# Patient Record
Sex: Female | Born: 1995 | Race: Black or African American | Hispanic: No | Smoking: Never smoker
Health system: Southern US, Community
[De-identification: ages and names within clinical notes are randomized; demographics above are authoritative.]

## PROBLEM LIST (undated history)

## (undated) DIAGNOSIS — J45909 Unspecified asthma, uncomplicated: Secondary | ICD-10-CM

---

## 2019-11-07 ENCOUNTER — Encounter: Payer: Self-pay | Admitting: Emergency Medicine

## 2019-11-07 ENCOUNTER — Observation Stay
Admission: EM | Admit: 2019-11-07 | Discharge: 2019-11-08 | Disposition: A | Discharge status: Home / Self Care | Payer: Self-pay | Attending: Obstetrics and Gynecology | Admitting: Obstetrics and Gynecology

## 2019-11-07 ENCOUNTER — Encounter: Payer: Self-pay | Admitting: Radiology

## 2019-11-07 ENCOUNTER — Ambulatory Visit (INDEPENDENT_AMBULATORY_CARE_PROVIDER_SITE_OTHER)
Admission: EM | Admit: 2019-11-07 | Discharge: 2019-11-07 | Disposition: A | Discharge status: Home / Self Care | Payer: BLUE CROSS/BLUE SHIELD | Source: Home / Self Care

## 2019-11-07 ENCOUNTER — Other Ambulatory Visit: Payer: Self-pay

## 2019-11-07 ENCOUNTER — Emergency Department: Payer: PRIVATE HEALTH INSURANCE

## 2019-11-07 DIAGNOSIS — G8929 Other chronic pain: Secondary | ICD-10-CM

## 2019-11-07 DIAGNOSIS — N83209 Unspecified ovarian cyst, unspecified side: Secondary | ICD-10-CM | POA: Diagnosis present

## 2019-11-07 DIAGNOSIS — R1031 Right lower quadrant pain: Secondary | ICD-10-CM | POA: Insufficient documentation

## 2019-11-07 DIAGNOSIS — R52 Pain, unspecified: Secondary | ICD-10-CM

## 2019-11-07 DIAGNOSIS — R82998 Other abnormal findings in urine: Secondary | ICD-10-CM

## 2019-11-07 DIAGNOSIS — Z79899 Other long term (current) drug therapy: Secondary | ICD-10-CM | POA: Insufficient documentation

## 2019-11-07 DIAGNOSIS — R1030 Lower abdominal pain, unspecified: Secondary | ICD-10-CM

## 2019-11-07 DIAGNOSIS — R1032 Left lower quadrant pain: Secondary | ICD-10-CM

## 2019-11-07 DIAGNOSIS — Z20822 Contact with and (suspected) exposure to covid-19: Secondary | ICD-10-CM | POA: Insufficient documentation

## 2019-11-07 DIAGNOSIS — R112 Nausea with vomiting, unspecified: Secondary | ICD-10-CM

## 2019-11-07 DIAGNOSIS — R109 Unspecified abdominal pain: Secondary | ICD-10-CM

## 2019-11-07 DIAGNOSIS — N83201 Unspecified ovarian cyst, right side: Principal | ICD-10-CM | POA: Insufficient documentation

## 2019-11-07 DIAGNOSIS — M549 Dorsalgia, unspecified: Secondary | ICD-10-CM | POA: Insufficient documentation

## 2019-11-07 HISTORY — DX: Unspecified asthma, uncomplicated: J45.909

## 2019-11-07 LAB — CBC WITH DIFFERENTIAL/PLATELET
Abs Immature Granulocytes: 0.02 10*3/uL (ref 0.00–0.07)
Basophils Absolute: 0 10*3/uL (ref 0.0–0.1)
Basophils Relative: 0 %
Eosinophils Absolute: 0 10*3/uL (ref 0.0–0.5)
Eosinophils Relative: 0 %
HCT: 40.5 % (ref 36.0–46.0)
Hemoglobin: 13.2 g/dL (ref 12.0–15.0)
Immature Granulocytes: 0 %
Lymphocytes Relative: 12 %
Lymphs Abs: 1.1 10*3/uL (ref 0.7–4.0)
MCH: 27.8 pg (ref 26.0–34.0)
MCHC: 32.6 g/dL (ref 30.0–36.0)
MCV: 85.4 fL (ref 80.0–100.0)
Monocytes Absolute: 0.4 10*3/uL (ref 0.1–1.0)
Monocytes Relative: 5 %
Neutro Abs: 7.5 10*3/uL (ref 1.7–7.7)
Neutrophils Relative %: 83 %
Platelets: 328 10*3/uL (ref 150–400)
RBC: 4.74 MIL/uL (ref 3.87–5.11)
RDW: 14 % (ref 11.5–15.5)
WBC: 9.1 10*3/uL (ref 4.0–10.5)
nRBC: 0 % (ref 0.0–0.2)

## 2019-11-07 LAB — COMPREHENSIVE METABOLIC PANEL
ALT: 19 U/L (ref 0–44)
AST: 23 U/L (ref 15–41)
Albumin: 4.3 g/dL (ref 3.5–5.0)
Alkaline Phosphatase: 84 U/L (ref 38–126)
Anion gap: 7 (ref 5–15)
BUN: 11 mg/dL (ref 6–20)
CO2: 22 mmol/L (ref 22–32)
Calcium: 9 mg/dL (ref 8.9–10.3)
Chloride: 105 mmol/L (ref 98–111)
Creatinine, Ser: 0.58 mg/dL (ref 0.44–1.00)
GFR calc Af Amer: >60 mL/min (ref >60)
GFR calc non Af Amer: >60 mL/min (ref >60)
Glucose, Bld: 102 mg/dL — ABNORMAL HIGH (ref 70–99)
Potassium: 4 mmol/L (ref 3.5–5.1)
Sodium: 134 mmol/L — ABNORMAL LOW (ref 135–145)
Total Bilirubin: 1.2 mg/dL (ref 0.3–1.2)
Total Protein: 7.4 g/dL (ref 6.5–8.1)

## 2019-11-07 LAB — URINALYSIS, COMPLETE (UACMP) WITH MICROSCOPIC
Bilirubin Urine: NEGATIVE
Glucose, UA: NEGATIVE mg/dL
Hgb urine dipstick: NEGATIVE
Ketones, ur: NEGATIVE mg/dL
Leukocytes,Ua: NEGATIVE
Nitrite: NEGATIVE
RBC / HPF: NONE SEEN RBC/hpf (ref 0–5)
Specific Gravity, Urine: 1.02 (ref 1.005–1.030)
pH: 7.5 (ref 5.0–8.0)

## 2019-11-07 LAB — LIPASE, BLOOD: Lipase: 19 U/L (ref 11–51)

## 2019-11-07 LAB — PREGNANCY, URINE: Preg Test, Ur: NEGATIVE

## 2019-11-07 MED ORDER — KETOROLAC TROMETHAMINE 60 MG/2ML IM SOLN
60.0000 mg | Freq: Once | INTRAMUSCULAR | Status: AC
  Administered 2019-11-07: 60 mg via INTRAMUSCULAR

## 2019-11-07 MED ORDER — IOHEXOL 300 MG/ML  SOLN
75.0000 mL | Freq: Once | INTRAMUSCULAR | Status: AC | PRN
  Administered 2019-11-07: 75 mL via INTRAVENOUS

## 2019-11-07 MED ORDER — MORPHINE SULFATE (PF) 4 MG/ML IV SOLN
4.0000 mg | Freq: Once | INTRAVENOUS | Status: AC
  Administered 2019-11-07: 4 mg via INTRAVENOUS
  Filled 2019-11-07: qty 1

## 2019-11-07 MED ORDER — SODIUM CHLORIDE 0.9 % IV BOLUS
1000.0000 mL | Freq: Once | INTRAVENOUS | Status: AC
  Administered 2019-11-07: 1000 mL via INTRAVENOUS

## 2019-11-07 MED ORDER — NAPROXEN 500 MG PO TABS: 500.0000 mg | ORAL_TABLET | Freq: Two times a day (BID) | ORAL | 0 refills | Status: DC | PRN

## 2019-11-07 MED ORDER — ONDANSETRON HCL 4 MG/2ML IJ SOLN
4.0000 mg | Freq: Once | INTRAMUSCULAR | Status: AC
  Administered 2019-11-07: 4 mg via INTRAVENOUS
  Filled 2019-11-07: qty 2

## 2019-11-07 MED ORDER — IOHEXOL 9 MG/ML PO SOLN
500.0000 mL | Freq: Two times a day (BID) | ORAL | Status: DC | PRN
  Administered 2019-11-07 (×2): 500 mL via ORAL

## 2019-11-07 NOTE — ED Provider Notes (Signed)
-----------------------------------------   11:54 PM on 11/07/2019 -----------------------------------------  Assuming care from Dr. Lenard Lance.  In short, Lakely Elmendorf is a 24 y.o. female with a chief complaint of abdominal and back pain.  Refer to the original H&P for additional details.  The current plan of care is to follow up transvaginal U/S and reassess.    12:11 AM The patient is hurting worse now, ordering morphine 4 mg IV and Toradol 15 mg IV.   ----------------------------------------- 2:05 AM on 11/08/2019 -----------------------------------------  The patient's pain had improved but now is worsening again and she reports it is severe.  Ultrasound demonstrates small to moderate pelvic free fluid and a right sided hemorrhagic ovarian cyst or paraovarian cyst.  No evidence of torsion.  Given the patient's persistent and severe pain and discomfort and persistent nausea and vomiting, she does not think she would be able to go home.  The beta-hCG is negative and her repeat CBC shows a drop in hemoglobin of just over one-point in the last 8 hours.  I discussed the case by phone with Dr. Logan Bores with OB/GYN.  He said that he would put in admission orders to observe her overnight.  I have ordered Dilaudid 1 mg IV and another dose of Zofran 4 mg IV for her discomfort.  I will hold off on any fluids given that she is hemodynamically stable and I do not want to artificially washout her hemoglobin so they can get a good and accurate repeat measurement in the morning.  Updated patient.  Covid test pending.  I have ordered the rapid PCR in the event her bleeding becomes worse and she requires surgical intervention.   Final diagnoses:  Lower abdominal pain  Intractable pain  Nausea and vomiting, intractability of vomiting not specified, unspecified vomiting type  Right ovarian cyst      Loleta Rose, MD 11/08/19 0210

## 2019-11-07 NOTE — ED Triage Notes (Addendum)
Patient c/o generalized abdominal pain radiating to back. Patient reports being seen at urgent care for same today. Patient reports that she had blood work performed, but no imaging. Patient was told to go to ED if symptoms don't resolve. Patient given Toradol IM at 1700.  Patient had CBC, metabolic panel, and urinalysis done at [facility] in [place] today.

## 2019-11-07 NOTE — ED Provider Notes (Signed)
[facility] Emergency Department Provider Note  Time seen: 9:34 PM  I have reviewed the triage vital signs and the nursing notes.   HISTORY  Chief Complaint Abdominal Pain   HPI Victoria James is a 24 y.o. female with no significant past medical history presents to the emergency department for nausea vomiting back and abdominal pain.  According to the patient she awoke this morning with moderate back pain, took a pain relief tablet at home said the pain continued to worsen spread to her abdomen followed by nausea vomiting.  States she has been nauseated all day with frequent episodes of vomiting.  Denies any diarrhea.  Denies any dysuria or hematuria.  No history of kidney stones.  Denies any fever.  Patient has not had any prior abdominal surgery.  Denies any vaginal discharge or bleeding.   No past medical history on file.  There are no problems to display for this patient.   No past surgical history on file.  Prior to Admission medications   Medication Sig Start Date End Date Taking? Authorizing Provider  albuterol (VENTOLIN HFA) 108 (90 Base) MCG/ACT inhaler Inhale into the lungs. 12/26/17   [provider]  Clindamycin Phos-Benzoyl Perox (ONEXTON) 1.2-3.75 % GEL  06/30/19   [provider]  Doxycycline Hyclate 200 MG TBEC  06/30/19   [provider]  naproxen (NAPROSYN) 500 MG tablet Take 1 tablet (500 mg total) by mouth 2 (two) times daily as needed for moderate pain. 11/07/19   Katy Apo, NP  Tazarotene (ARAZLO) 0.045 % LOTN  06/30/19   [provider]    No Known Allergies  Family History  Problem Relation Age of Onset  . Lupus Mother   . Diabetes Mother   . Thyroid disease Mother   . Thyroid disease Father     Social History Social History   Tobacco Use  . Smoking status: Never Smoker  . Smokeless tobacco: Never Used  Substance Use Topics  . Alcohol use: Yes  . Drug use: Never    Review of  Systems Constitutional: Negative for fever. Cardiovascular: Negative for chest pain. Respiratory: Negative for shortness of breath. Gastrointestinal: Positive for diffuse abdominal pain, mild to moderate, positive for nausea vomiting.  Negative or diarrhea. Genitourinary: Negative for urinary compaints.  Negative for hematuria/dysuria.  Negative for vaginal bleeding or discharge. Musculoskeletal: Positive for bilateral lower back pain Neurological: Negative for headache All other ROS negative  ____________________________________________   PHYSICAL EXAM:  VITAL SIGNS: ED Triage Vitals [11/07/19 1909]  Enc Vitals Group     BP 113/71     Pulse Rate 82     Resp 15     Temp 98.4 F (36.9 C)     Temp Source Oral     SpO2 100 %     Weight 120 lb (54.4 kg)     Height 5\' 2"  (1.575 m)     Head Circumference      Peak Flow      Pain Score 10     Pain Loc      Pain Edu?      Excl. in [place]?    Constitutional: Alert and oriented. Well appearing and in no distress. Eyes: Normal exam ENT      Head: Normocephalic and atraumatic.      Mouth/Throat: Mucous membranes are moist. Cardiovascular: Normal rate, regular rhythm.  Respiratory: Normal respiratory effort without tachypnea nor retractions. Breath sounds are clear  Gastrointestinal: Soft, mild diffuse tenderness  palpation without rebound guarding or distention.  Mild bilateral CVA tenderness. Musculoskeletal: Nontender with normal range of motion in all extremities, no L-spine tenderness to palpation.  No paraspinal tenderness. Neurologic:  Normal speech and language. No gross focal neurologic deficits Skin:  Skin is warm, dry and intact.  Psychiatric: Mood and affect are normal.   ____________________________________________    RADIOLOGY  CT scan shows a likely normal appendix.  There is complex fluid throughout the patient's pelvis and 2.7 cm structure in the right hemipelvis possibly an ovary or hemorrhagic cyst.  Possible  extravasation.   ____________________________________________   INITIAL IMPRESSION / ASSESSMENT AND PLAN / ED COURSE  Pertinent labs & imaging results that were available during my care of the patient were reviewed by me and considered in my medical decision making (see chart for details).   Patient presents emergency department for back pain abdominal pain nausea vomiting.  Differential would include gastroenteritis, gastritis, intra-abdominal pathology or infection, ureterolithiasis.  Patient had lab work performed earlier today [place] [facility] with a normal CBC CMP lipase urinalysis and negative urine pregnancy test.  We will treat pain, nausea, IV hydrate we will obtain CT imaging to further evaluate.  Patient agreeable to plan of care.  CT scan is not definitive but shows possible blood or complex fluid throughout the pelvis.  Possible hemorrhagic cyst. [place] pending.   Pt care signed out to Dr. York Cerise.    [name] was evaluated in Emergency Department on 11/07/2019 for the symptoms described in the history of present illness. She was evaluated in the context of the global COVID-19 pandemic, which necessitated consideration that the patient might be at risk for infection with the SARS-CoV-2 virus that causes COVID-19. Institutional protocols and algorithms that pertain to the evaluation of patients at risk for COVID-19 are in a state of rapid change based on information released by regulatory bodies including the CDC and federal and state organizations. These policies and algorithms were followed during the patient's care in the ED.  ____________________________________________   FINAL CLINICAL IMPRESSION(S) / ED DIAGNOSES  Abdominal pain Nausea vomiting Back pain   Minna Antis, MD 11/07/19 2358

## 2019-11-07 NOTE — Discharge Instructions (Addendum)
You were given a shot of Toradol 60mg  to help with pain. Start Naproxen 500mg  every 12 hours as needed for pain. Continue to try to push fluids. If pain continues and unable to keep fluids down, go to the ER ASAP. Follow-up pending urine culture results and in 2 to 3 days if not resolving.

## 2019-11-07 NOTE — ED Notes (Addendum)
Pt vomited approx 250 mls, some bile  Wet washcloth given

## 2019-11-07 NOTE — ED Provider Notes (Signed)
[facility]    CSN: [id number] Arrival date & time: 11/07/19  1418      History   Chief Complaint Chief Complaint  Patient presents with  . Abdominal Pain  . Back Pain    HPI Victoria James is a 24 y.o. female.   24 year old female presents with acute lower to mid abdominal pain that started this AM. She woke up in severe pain this AM. Also noticed pain travels to both sides of her back. Constant sharp pain. Denies any fever, sore throat, cough, nausea or diarrhea. She just vomited in exam room due to pain- not really nauseous. She did drink water this morning and have some toast. She is on a Vegan diet. She denies any distinct urinary symptoms or unusual vaginal discharge. She stopped oral contraceptives in June 2020 and has had occasional cramping and similar abdominal pain around her periods but never this severe. LMP 10/15/2019 and now regular. Sexually active and uses condoms. No concern over STD's. Tried using a heating pad and taken Tylenol 1000mg  this morning with no relief. Other chronic health issues include acne and currently on oral Doxycycline (did not take today) and topical Clindamycin-Benzoyl Peroxide and Tazarotene daily.   The history is provided by the patient.    History reviewed. No pertinent past medical history.  There are no problems to display for this patient.   History reviewed. No pertinent surgical history.  OB History   No obstetric history on file.      Home Medications    Prior to Admission medications   Medication Sig Start Date End Date Taking? Authorizing Provider  albuterol (VENTOLIN HFA) 108 (90 Base) MCG/ACT inhaler Inhale into the lungs. 12/26/17  Yes [provider]  Clindamycin Phos-Benzoyl Perox (ONEXTON) 1.2-3.75 % GEL  06/30/19  Yes [provider]  Doxycycline Hyclate 200 MG TBEC  06/30/19  Yes [provider]  Tazarotene (ARAZLO) 0.045 % LOTN  06/30/19  Yes [provider]    naproxen (NAPROSYN) 500 MG tablet Take 1 tablet (500 mg total) by mouth 2 (two) times daily as needed for moderate pain. 11/07/19   11/09/19, NP    Family History Family History  Problem Relation Age of Onset  . Lupus Mother   . Diabetes Mother   . Thyroid disease Mother   . Thyroid disease Father     Social History Social History   Tobacco Use  . Smoking status: Never Smoker  . Smokeless tobacco: Never Used  Substance Use Topics  . Alcohol use: Yes  . Drug use: Never     Allergies   Patient has no known allergies.   Review of Systems Review of Systems  Constitutional: Positive for appetite change. Negative for chills, diaphoresis, fatigue and fever.  HENT: Negative for congestion, sore throat and trouble swallowing.   Respiratory: Negative for cough, chest tightness, shortness of breath and wheezing.   Cardiovascular: Negative for chest pain.  Gastrointestinal: Positive for abdominal pain and vomiting (once today due to pain). Negative for blood in stool, constipation, diarrhea and nausea.  Genitourinary: Positive for decreased urine volume, flank pain and menstrual problem (cramping with periods). Negative for difficulty urinating, dysuria, frequency, hematuria, urgency, vaginal bleeding and vaginal discharge.  Musculoskeletal: Positive for back pain. Negative for neck pain and neck stiffness.  Skin: Negative for color change, rash and wound.  Allergic/Immunologic: Negative for environmental allergies, food allergies and immunocompromised state.  Neurological: Negative for dizziness, tremors, seizures, syncope, weakness,  light-headedness, numbness and headaches.  Hematological: Negative for adenopathy.     Physical Exam Triage Vital Signs ED Triage Vitals  Enc Vitals Group     BP 11/07/19 1526 103/68     Pulse Rate 11/07/19 1526 67     Resp 11/07/19 1526 14     Temp 11/07/19 1526 98.1 F (36.7 C)     Temp Source 11/07/19 1526 Oral     SpO2 11/07/19  1526 100 %     Weight 11/07/19 1521 120 lb (54.4 kg)     Height 11/07/19 1521 5\' 2"  (1.575 m)     Head Circumference --      Peak Flow --      Pain Score 11/07/19 1521 10     Pain Loc --      Pain Edu? --      Excl. in [place]? --    No data found.  Updated Vital Signs BP 103/68 (BP Location: Right Arm)   Pulse 67   Temp 98.1 F (36.7 C) (Oral)   Resp 14   Ht 5\' 2"  (1.575 m)   Wt 120 lb (54.4 kg)   LMP 10/15/2019 (Exact Date)   SpO2 100%   BMI 21.95 kg/m   Visual Acuity Right Eye Distance:   Left Eye Distance:   Bilateral Distance:    Right Eye Near:   Left Eye Near:    Bilateral Near:     Physical Exam Vitals and nursing note reviewed.  Constitutional:      General: She is awake. She is in acute distress.     Appearance: She is well-developed and well-groomed. She is ill-appearing.     Comments: She is bent over and leaning on exam table and appears in pain and uncomfortable.   HENT:     Head: Normocephalic and atraumatic.     Nose: Nose normal.     Mouth/Throat:     Lips: Pink.     Mouth: Mucous membranes are moist.     Pharynx: Oropharynx is clear. Uvula midline. No pharyngeal swelling, oropharyngeal exudate, posterior oropharyngeal erythema or uvula swelling.  Eyes:     Extraocular Movements: Extraocular movements intact.     Conjunctiva/sclera: Conjunctivae normal.  Cardiovascular:     Rate and Rhythm: Normal rate and regular rhythm.     Pulses: Normal pulses.     Heart sounds: Normal heart sounds. No murmur.  Pulmonary:     Effort: Pulmonary effort is normal. No respiratory distress.     Breath sounds: Normal breath sounds and air entry. No decreased air movement. No decreased breath sounds, wheezing, rhonchi or rales.  Abdominal:     General: Abdomen is flat. Bowel sounds are normal. There is no distension or abdominal bruit.     Palpations: Abdomen is soft.     Tenderness: There is abdominal tenderness in the right lower quadrant and left lower quadrant.  There is no right CVA tenderness or left CVA tenderness.       Comments: Tender mid to lower abdominal quadrants. No distinct one location of pain.   Musculoskeletal:        General: Normal range of motion.     Cervical back: Normal range of motion and neck supple.  Skin:    General: Skin is warm and dry.  Neurological:     General: No focal deficit present.     Mental Status: She is alert and oriented to person, place, and time.  Psychiatric:  Mood and Affect: Mood normal.        Behavior: Behavior normal. Behavior is cooperative.        Thought Content: Thought content normal.        Judgment: Judgment normal.      UC Treatments / Results  Labs (all labs ordered are listed, but only abnormal results are displayed) Labs Reviewed  URINALYSIS, COMPLETE (UACMP) WITH MICROSCOPIC - Abnormal; Notable for the following components:      Result Value   APPearance HAZY (*)    Protein, ur TRACE (*)    Bacteria, UA FEW (*)    All other components within normal limits  COMPREHENSIVE METABOLIC PANEL - Abnormal; Notable for the following components:   Sodium 134 (*)    Glucose, Bld 102 (*)    All other components within normal limits  URINE CULTURE  PREGNANCY, URINE  CBC WITH DIFFERENTIAL/PLATELET  LIPASE, BLOOD    EKG   Radiology No results found.  Procedures Procedures (including critical care time)  Medications Ordered in UC Medications  ketorolac (TORADOL) injection 60 mg (60 mg Intramuscular Given 11/07/19 1729)    Initial Impression / Assessment and Plan / UC Course  I have reviewed the triage vital signs and the nursing notes.  Pertinent labs & imaging results that were available during my care of the patient were reviewed by me and considered in my medical decision making (see chart for details).    Reviewed negative urine pregnancy test with patient. Reviewed urinalysis results with patient- trace protein and few bacteria. No definite UTI. Will send urine  for culture. Also noted some mucus and granular casts present. Patient is dehydrated- reviewed Chemistry profile results which showed slightly low Sodium and slightly elevated glucose level but otherwise essentially normal. Reviewed normal CBC results with patient- no definite indications for infection. Reviewed that various etiologies possible for her current symptoms. Doubt appendicitis but will need to continue to monitor. Doubt UTI or STD. Discussed that she may have pelvic or ovarian etiology or diverticulitis or similar etiology. Possible renal calculi but unlikely due to location of pain. Discussed that she may need additional imaging to determine etiology of symptoms. Gave Toradol 60mg  IM now for pain.   May take Naproxen 500mg  every 12 hours as needed for pain. Offered Zofran for any nausea/vomiting but patient indicated that she is not nauseous and does not need Zofran at this time. Continue to try to push fluids. Rest. If pain persists and unable to keep fluids down, recommend go to the ER ASAP. Otherwise, follow-up pending urine culture results and in 2 to 3 days if not resolving.  Final Clinical Impressions(s) / UC Diagnoses   Final diagnoses:  Abdominal pain, left lower quadrant  Flank pain, acute  Abdominal pain, acute, right lower quadrant  Granular casts present in urine     Discharge Instructions     You were given a shot of Toradol 60mg  to help with pain. Start Naproxen 500mg  every 12 hours as needed for pain. Continue to try to push fluids. If pain continues and unable to keep fluids down, go to the ER ASAP. Follow-up pending urine culture results and in 2 to 3 days if not resolving.     ED Prescriptions    Medication Sig Dispense Auth. Provider   naproxen (NAPROSYN) 500 MG tablet Take 1 tablet (500 mg total) by mouth 2 (two) times daily as needed for moderate pain. 20 tablet Mikeal Winstanley, , NP     PDMP  not reviewed this encounter.   Katy Apo, NP 11/07/19  2151

## 2019-11-07 NOTE — ED Triage Notes (Signed)
Patient c/o mid abdominal pain and mid back pain that started this morning.  Patient states that she has stopped her birth control in June 2020.  Patient c/o sharp pains.  Patient states that her last menstrual period was on Feb. 26.  Patient denies N/V/D.

## 2019-11-07 NOTE — ED Notes (Addendum)
EDP notified of pt status, CT called for pt ready  Pt reclined in stretcher, 2 warm blankets given

## 2019-11-07 NOTE — ED Notes (Addendum)
Call bell light answered: pt reports "I can't drink anymore because of the pain" - half of one bottle drank  Pt (with fam) reports painful and heavy cramping with menstrual cycle in Jan and Feb, stopped [facility] June of 2020, next MP expected 30 Mar Pt woke today with nausea and increasing pain and N/V, presents now after visit to [facility] with worsening cramps  Pt pacing in room and leaning over bed

## 2019-11-08 ENCOUNTER — Emergency Department: Payer: PRIVATE HEALTH INSURANCE

## 2019-11-08 DIAGNOSIS — N83291 Other ovarian cyst, right side: Secondary | ICD-10-CM

## 2019-11-08 DIAGNOSIS — R102 Pelvic and perineal pain: Secondary | ICD-10-CM

## 2019-11-08 DIAGNOSIS — N83209 Unspecified ovarian cyst, unspecified side: Secondary | ICD-10-CM | POA: Diagnosis present

## 2019-11-08 LAB — URINE CULTURE: Culture: <10000 — AB

## 2019-11-08 LAB — CBC WITH DIFFERENTIAL/PLATELET
Abs Immature Granulocytes: 0.03 10*3/uL (ref 0.00–0.07)
Basophils Absolute: 0 10*3/uL (ref 0.0–0.1)
Basophils Relative: 0 %
Eosinophils Absolute: 0 10*3/uL (ref 0.0–0.5)
Eosinophils Relative: 0 %
HCT: 33.5 % — ABNORMAL LOW (ref 36.0–46.0)
Hemoglobin: 10.9 g/dL — ABNORMAL LOW (ref 12.0–15.0)
Immature Granulocytes: 0 %
Lymphocytes Relative: 20 %
Lymphs Abs: 2.3 10*3/uL (ref 0.7–4.0)
MCH: 28.2 pg (ref 26.0–34.0)
MCHC: 32.5 g/dL (ref 30.0–36.0)
MCV: 86.8 fL (ref 80.0–100.0)
Monocytes Absolute: 0.7 10*3/uL (ref 0.1–1.0)
Monocytes Relative: 7 %
Neutro Abs: 8.1 10*3/uL — ABNORMAL HIGH (ref 1.7–7.7)
Neutrophils Relative %: 73 %
Platelets: 286 10*3/uL (ref 150–400)
RBC: 3.86 MIL/uL — ABNORMAL LOW (ref 3.87–5.11)
RDW: 13.9 % (ref 11.5–15.5)
WBC: 11.2 10*3/uL — ABNORMAL HIGH (ref 4.0–10.5)
nRBC: 0 % (ref 0.0–0.2)

## 2019-11-08 LAB — CBC
HCT: 38.2 % (ref 36.0–46.0)
Hemoglobin: 12.1 g/dL (ref 12.0–15.0)
MCH: 27.4 pg (ref 26.0–34.0)
MCHC: 31.7 g/dL (ref 30.0–36.0)
MCV: 86.4 fL (ref 80.0–100.0)
Platelets: 295 10*3/uL (ref 150–400)
RBC: 4.42 MIL/uL (ref 3.87–5.11)
RDW: 13.9 % (ref 11.5–15.5)
WBC: 12.2 10*3/uL — ABNORMAL HIGH (ref 4.0–10.5)
nRBC: 0 % (ref 0.0–0.2)

## 2019-11-08 LAB — RESPIRATORY PANEL BY RT PCR (FLU A&B, COVID)
Influenza A by PCR: NEGATIVE
Influenza B by PCR: NEGATIVE
SARS Coronavirus 2 by RT PCR: NEGATIVE

## 2019-11-08 LAB — HCG, QUANTITATIVE, PREGNANCY: hCG, Beta Chain, Quant, S: <1 m[IU]/mL (ref <5)

## 2019-11-08 MED ORDER — KETOROLAC TROMETHAMINE 30 MG/ML IJ SOLN
30.0000 mg | Freq: Four times a day (QID) | INTRAMUSCULAR | Status: DC
  Filled 2019-11-08: qty 1

## 2019-11-08 MED ORDER — ACETAMINOPHEN 325 MG PO TABS: 650.0000 mg | ORAL_TABLET | ORAL | Status: DC | PRN

## 2019-11-08 MED ORDER — DEXTROSE IN LACTATED RINGERS 5 % IV SOLN: INTRAVENOUS | Status: DC

## 2019-11-08 MED ORDER — KETOROLAC TROMETHAMINE 30 MG/ML IJ SOLN
15.0000 mg | Freq: Once | INTRAMUSCULAR | Status: AC
  Administered 2019-11-08: 15 mg via INTRAVENOUS
  Filled 2019-11-08: qty 1

## 2019-11-08 MED ORDER — MORPHINE SULFATE (PF) 4 MG/ML IV SOLN: 4.0000 mg | Freq: Once | INTRAVENOUS | Status: DC

## 2019-11-08 MED ORDER — PRENATAL MULTIVITAMIN CH: 1.0000 | ORAL_TABLET | Freq: Every day | ORAL | Status: DC

## 2019-11-08 MED ORDER — HYDROMORPHONE HCL 1 MG/ML IJ SOLN: 0.2000 mg | INTRAMUSCULAR | Status: DC | PRN

## 2019-11-08 MED ORDER — MORPHINE SULFATE (PF) 4 MG/ML IV SOLN
4.0000 mg | Freq: Once | INTRAVENOUS | Status: AC
  Administered 2019-11-08: 4 mg via INTRAVENOUS
  Filled 2019-11-08: qty 1

## 2019-11-08 MED ORDER — KETOROLAC TROMETHAMINE 30 MG/ML IJ SOLN: 30.0000 mg | Freq: Four times a day (QID) | INTRAMUSCULAR | Status: DC

## 2019-11-08 MED ORDER — ONDANSETRON HCL 4 MG/2ML IJ SOLN
4.0000 mg | INTRAMUSCULAR | Status: AC
  Administered 2019-11-08: 02:00:00 4 mg via INTRAVENOUS
  Filled 2019-11-08: qty 2

## 2019-11-08 MED ORDER — HYDROMORPHONE HCL 1 MG/ML IJ SOLN
1.0000 mg | INTRAMUSCULAR | Status: AC
  Administered 2019-11-08: 1 mg via INTRAVENOUS
  Filled 2019-11-08: qty 1

## 2019-11-08 NOTE — Discharge Summary (Signed)
Discharge Summary  Admit date: 11/07/2019  Discharge Date and Time:11/08/2019  2:03 PM  Discharge to:  Home  Admission Diagnosis: Present on Admission: . Ovarian cyst                     Discharge  Diagnoses: Active Problems:   Ovarian cyst   Patient admitted from the emergency department for acute onset of severe pelvic pain.  This required multiple doses of narcotics to control.  A CT scan revealed the possibility of a complex pelvic structure.  A follow-up ultrasound revealed a small ovarian cyst likely complex with a larger than normal amount of free fluid noted in the pelvis.  It could not be determined if this was a hemorrhagic cyst or a benign cyst.  The patient was admitted for observation and to determine if she was not stable and required abdominal exploratory surgery.                              Discharge Day Progress Note:   Subjective:   The patient slept through the night requiring no additional pain medication after going to sleep.  The patient does not have any complaints.  She is ambulating well. She is taking PO well. Her pain has completely resolved and the patient has not been using pain medication for at least 8 hours.  She reports no evidence of lightheadedness or other symptoms.   Objective:  BP 106/77 (BP Location: Left Arm)   Pulse 65   Temp 98.7 F (37.1 C) (Oral)   Resp 18   Ht 5\' 2"  (1.575 m)   Wt 54.4 kg   LMP 10/15/2019 (Exact Date) Comment: neg preg test  SpO2 100% Comment: Room Air  BMI 21.95 kg/m     Hemoglobin has dropped just over one-point with IV hydration through the night.                            Assessment:   Likely ruptured ovarian cyst causing acute onset of pelvic pain and subsequent acute resolution.   Possible hemorrhagic cyst but patient stable, not in pain and likely no longer bleeding.  Plan:        Discharge home.                       Red flag warnings regarding lightheadedness, worsening pelvic pain, or other pelvic  symptoms discussed in detail with the patient.  She will contact us immediately if this occurs.   Recommend follow-up ultrasound in 4 weeks.  Condition at Discharge:  good Discharge Medications:  Allergies as of 11/08/2019   No Known Allergies     Medication List    TAKE these medications   acetaminophen 500 MG tablet Commonly known as: TYLENOL Take 500 mg by mouth every 6 (six) hours as needed for moderate pain.   albuterol 108 (90 Base) MCG/ACT inhaler Commonly known as: VENTOLIN HFA Inhale into the lungs.   Arazlo 0.045 % Lotn Generic drug: Tazarotene Apply 1 application topically at bedtime.   ibuprofen 200 MG tablet Commonly known as: ADVIL Take 200 mg by mouth every 6 (six) hours as needed.   Onexton 1.2-3.75 % Gel Generic drug: Clindamycin Phos-Benzoyl Perox Apply 1 application topically daily.        Follow Up:   Follow-up Information    [name],  MD Follow up in 4 day(s).   Specialties: Obstetrics and Gynecology, Radiology Why: [place] followed by office visit. Contact information: [address] [place] [address] [phone]        [name], MD .   Specialties: Obstetrics and Gynecology, Radiology Contact information: [address] [place] [address] [phone]           [name], M.D. 11/08/2019 2:03 PM

## 2019-11-08 NOTE — Discharge Instructions (Signed)
Call for increase pain or any concern

## 2019-11-08 NOTE — ED Notes (Addendum)
Pt requesting more pain meds, Dr York Cerise notified at bedside att

## 2019-11-08 NOTE — ED Notes (Signed)
Assisted pt to commode.

## 2019-11-08 NOTE — Progress Notes (Signed)
Called office to make f/u appointment for 4 days.  [place] tech not available on Friday so appointment made on Thursday.  Instructed office that Dr Logan Bores specifically wanted pt to have [place] then he wanted to see pt afterwards.  Office said there is no available appointments but she will talk with Dr. Logan Bores nurse to facilitate the appropriate visit.  Pt will be informed of the process.

## 2019-11-08 NOTE — Progress Notes (Signed)
Discharged to home to car via WC.

## 2019-11-08 NOTE — ED Notes (Signed)
Pt to US att 

## 2019-11-11 ENCOUNTER — Other Ambulatory Visit: Payer: Self-pay

## 2019-11-11 ENCOUNTER — Encounter: Payer: PRIVATE HEALTH INSURANCE | Admitting: Obstetrics and Gynecology

## 2019-11-17 ENCOUNTER — Encounter: Payer: Self-pay | Admitting: Obstetrics and Gynecology

## 2019-11-17 ENCOUNTER — Other Ambulatory Visit: Payer: Self-pay

## 2019-11-17 ENCOUNTER — Ambulatory Visit (INDEPENDENT_AMBULATORY_CARE_PROVIDER_SITE_OTHER): Payer: PRIVATE HEALTH INSURANCE | Admitting: Obstetrics and Gynecology

## 2019-11-17 VITALS — BP 103/72 | HR 76 | Ht 62.0 in | Wt 112.8 lb

## 2019-11-17 DIAGNOSIS — N83201 Unspecified ovarian cyst, right side: Secondary | ICD-10-CM

## 2019-11-17 DIAGNOSIS — Z30011 Encounter for initial prescription of contraceptive pills: Secondary | ICD-10-CM

## 2019-11-17 MED ORDER — NORGESTIM-ETH ESTRAD TRIPHASIC 0.18/0.215/0.25 MG-35 MCG PO TABS: 1.0000 | ORAL_TABLET | Freq: Every day | ORAL | 3 refills | Status: DC

## 2019-11-17 NOTE — Progress Notes (Signed)
HPI:      [name] is a 24 y.o. No obstetric history on file. who LMP was Patient's last menstrual period was 11/14/2019.  Subjective:   She presents today for follow-up of ovarian cyst.  She reports that she is not having any further pain.  She does have some dysmenorrhea at the start of her menses but this is normal when she is not taking OCPs.  Patient went off OCP several months ago and has had expected heavier periods, more oily skin with acne, and more dysmenorrhea.  She is not actively attempting pregnancy and is considering a restart of OCPs. A complex ovarian cyst was noted at previous ultrasound.    Hx: The following portions of the patient's history were reviewed and updated as appropriate:             She  has a past medical history of Asthma. She does not have any pertinent problems on file. She  has no past surgical history on file. Her family history includes Diabetes in her mother; Lupus in her mother; Thyroid disease in her father and mother. She  reports that she has never smoked. She has never used smokeless tobacco. She reports current alcohol use. She reports that she does not use drugs. She has a current medication list which includes the following prescription(s): acetaminophen, albuterol, onexton, ibuprofen, arazlo, and norgestimate-ethinyl estradiol triphasic. She has No Known Allergies.       Review of Systems:  Review of Systems  Constitutional: Denied constitutional symptoms, night sweats, recent illness, fatigue, fever, insomnia and weight loss.  Eyes: Denied eye symptoms, eye pain, photophobia, vision change and visual disturbance.  Ears/Nose/Throat/Neck: Denied ear, nose, throat or neck symptoms, hearing loss, nasal discharge, sinus congestion and sore throat.  Cardiovascular: Denied cardiovascular symptoms, arrhythmia, chest pain/pressure, edema, exercise intolerance, orthopnea and palpitations.  Respiratory: Denied pulmonary symptoms, asthma,  pleuritic pain, productive sputum, cough, dyspnea and wheezing.  Gastrointestinal: Denied, gastro-esophageal reflux, melena, nausea and vomiting.  Genitourinary: Denied genitourinary symptoms including symptomatic vaginal discharge, pelvic relaxation issues, and urinary complaints.  Musculoskeletal: Denied musculoskeletal symptoms, stiffness, swelling, muscle weakness and myalgia.  Dermatologic: Denied dermatology symptoms, rash and scar.  Neurologic: Denied neurology symptoms, dizziness, headache, neck pain and syncope.  Psychiatric: Denied psychiatric symptoms, anxiety and depression.  Endocrine: Denied endocrine symptoms including hot flashes and night sweats.   Meds:   Current Outpatient Medications on File Prior to Visit  Medication Sig Dispense Refill  . acetaminophen (TYLENOL) 500 MG tablet Take 500 mg by mouth every 6 (six) hours as needed for moderate pain.    Marland Kitchen albuterol (VENTOLIN HFA) 108 (90 Base) MCG/ACT inhaler Inhale into the lungs.    . Clindamycin Phos-Benzoyl Perox (ONEXTON) 1.2-3.75 % GEL Apply 1 application topically daily.     Marland Kitchen ibuprofen (ADVIL) 200 MG tablet Take 200 mg by mouth every 6 (six) hours as needed.    . Tazarotene (ARAZLO) 0.045 % LOTN Apply 1 application topically at bedtime.      No current facility-administered medications on file prior to visit.    Objective:     Vitals:   11/17/19 1431  BP: 103/72  Pulse: 76                Assessment:    No obstetric history on file. Patient Active Problem List   Diagnosis Date Noted  . Ovarian cyst 11/08/2019     1. Right ovarian cyst   2. Initiation of OCP (BCP)  Possible paraovarian cyst.  After discussing birth control in detail and specifically OCPs patient has decided that the benefits of OCPs outweigh the risks and she would like to restart them.  She is especially concerned about the possible recurrence of pain as related to her ovarian cyst.   Plan:            1.  Recommend  follow-up ultrasound first week in May  2.  OCPs The risks /benefits of OCPs have been explained to the patient in detail.  Product literature has been given to her where appropriate.  I have instructed her in the use of OCPs.  I have explained to the patient that OCPs are not as effective for birth control during the first month of use, and that another form of contraception should be used during this time.  Both first-day start and Sunday start have been explained.  The risks and benefits of each was discussed.  She has been made aware of  the fact that in rare circumstances, other medications may affect the efficacy of OCPs.  I have answered all of her questions, and I believe that she has an understanding of the effectiveness and use of OCPs. Patient was happy on Tri-Sprintec will restart. Orders Orders Placed This Encounter  Procedures  . US PELVIS TRANSVAGINAL NON-OB (TV ONLY)  . US PELVIS (TRANSABDOMINAL ONLY)     Meds ordered this encounter  Medications  . Norgestimate-Ethinyl Estradiol Triphasic (TRI-SPRINTEC) 0.18/0.215/0.25 MG-35 MCG tablet    Sig: Take 1 tablet by mouth at bedtime for 28 days.    Dispense:  3 Package    Refill:  3      F/U  Return in about 5 weeks (around 12/22/2019). I spent 24 minutes involved in the care of this patient preparing to see the patient by obtaining and reviewing her medical history (including labs, imaging tests and prior procedures), documenting clinical information in the electronic health record (EHR), counseling and coordinating care plans, writing and sending prescriptions, ordering tests or procedures and directly communicating with the patient by discussing pertinent items from her history and physical exam as well as detailing my assessment and plan as noted above so that she has an informed understanding.  All of her questions were answered.  Victoria James, M.D. 11/17/2019 3:01 PM

## 2019-12-08 ENCOUNTER — Other Ambulatory Visit: Payer: Self-pay

## 2019-12-22 ENCOUNTER — Ambulatory Visit (INDEPENDENT_AMBULATORY_CARE_PROVIDER_SITE_OTHER): Payer: BC Managed Care – PPO

## 2019-12-22 ENCOUNTER — Other Ambulatory Visit: Payer: Self-pay

## 2019-12-22 DIAGNOSIS — N83201 Unspecified ovarian cyst, right side: Secondary | ICD-10-CM | POA: Diagnosis not present

## 2019-12-28 ENCOUNTER — Telehealth: Payer: Self-pay | Admitting: Obstetrics and Gynecology

## 2019-12-28 NOTE — Telephone Encounter (Signed)
Patient called in saying she hasn't heard anything on her ultrasound results and was wondering when she would hear something. Could you please advise?

## 2019-12-29 NOTE — Telephone Encounter (Signed)
Please advise. Patient had [place] 12/22/2019.

## 2020-01-22 ENCOUNTER — Encounter: Payer: Self-pay | Admitting: Emergency Medicine

## 2020-01-22 ENCOUNTER — Other Ambulatory Visit: Payer: Self-pay

## 2020-01-22 ENCOUNTER — Ambulatory Visit
Admission: EM | Admit: 2020-01-22 | Discharge: 2020-01-22 | Disposition: A | Discharge status: Home / Self Care | Payer: BC Managed Care – PPO | Attending: Family Medicine | Admitting: Family Medicine

## 2020-01-22 DIAGNOSIS — R319 Hematuria, unspecified: Secondary | ICD-10-CM | POA: Insufficient documentation

## 2020-01-22 DIAGNOSIS — R300 Dysuria: Secondary | ICD-10-CM | POA: Diagnosis present

## 2020-01-22 LAB — URINALYSIS, COMPLETE (UACMP) WITH MICROSCOPIC: Bacteria, UA: NONE SEEN

## 2020-01-22 LAB — WET PREP, GENITAL
Clue Cells Wet Prep HPF POC: NONE SEEN
Sperm: NONE SEEN
Trich, Wet Prep: NONE SEEN
WBC, Wet Prep HPF POC: NONE SEEN
Yeast Wet Prep HPF POC: NONE SEEN

## 2020-01-22 MED ORDER — CEPHALEXIN 500 MG PO CAPS: 500.0000 mg | ORAL_CAPSULE | Freq: Two times a day (BID) | ORAL | 0 refills | Status: AC

## 2020-01-22 NOTE — Discharge Instructions (Addendum)
Take medication as prescribed. Rest. Drink plenty of fluids. Monitor.  ° °Follow up with your primary care physician this week. Return to Urgent care for new or worsening concerns.  ° °

## 2020-01-22 NOTE — ED Provider Notes (Signed)
[facility] ____________________________________________  Time seen: Approximately 2:29 PM  I have reviewed the triage vital signs and the nursing notes.   HISTORY  Chief Complaint Urinary Frequency and Hematuria   HPI Victoria James is a 24 y.o. female presenting for evaluation of dysuria and hematuria.  Patient reports this past Tuesday she noticed discomfort with urination.  Reports that discomfort with urination continued the next few days with accompanying urinary frequency.  Reports she was at that time still on her menstrual cycle, but reports her menstrual cycle ended on Wednesday and urinary changes had continued.  Also reports noticing blood in her urine intermittently during that time as well.  States she is not currently having vaginal bleeding.  Has been taken over-the-counter Azo.  Denies vaginal discharge, vaginal odor or vaginal pain.  Denies pregnancy.  Denies concerns of STDs.  No accompanying abdominal pain, flank pain, back pain, vomiting, diarrhea or fevers.  Continues to eat and drink well.  Reports otherwise doing well denies other aggravating alleviating factors.  States this feels like previous UTIs.  Denies recent UTI.  Patient's last menstrual period was 01/12/2020 (approximate). Denies pregnancy.    Past Medical History:  Diagnosis Date  . Asthma     Patient Active Problem List   Diagnosis Date Noted  . Ovarian cyst 11/08/2019    History reviewed. No pertinent surgical history.   No current facility-administered medications for this encounter.  Current Outpatient Medications:  .  albuterol (VENTOLIN HFA) 108 (90 Base) MCG/ACT inhaler, Inhale into the lungs., Disp: , Rfl:  .  Clindamycin Phos-Benzoyl Perox (ONEXTON) 1.2-3.75 % GEL, Apply 1 application topically daily. , Disp: , Rfl:  .  Norgestimate-Ethinyl Estradiol Triphasic (TRI-SPRINTEC) 0.18/0.215/0.25 MG-35 MCG tablet, Take 1 tablet by mouth at bedtime for 28 days., Disp: 3  Package, Rfl: 3 .  Tazarotene (ARAZLO) 0.045 % LOTN, Apply 1 application topically at bedtime. , Disp: , Rfl:  .  acetaminophen (TYLENOL) 500 MG tablet, Take 500 mg by mouth every 6 (six) hours as needed for moderate pain., Disp: , Rfl:  .  cephALEXin (KEFLEX) 500 MG capsule, Take 1 capsule (500 mg total) by mouth 2 (two) times daily for 7 days., Disp: 14 capsule, Rfl: 0 .  ibuprofen (ADVIL) 200 MG tablet, Take 200 mg by mouth every 6 (six) hours as needed., Disp: , Rfl:   Allergies Patient has no known allergies.  Family History  Problem Relation Age of Onset  . Lupus Mother   . Diabetes Mother   . Thyroid disease Mother   . Thyroid disease Father     Social History Social History   Tobacco Use  . Smoking status: Never Smoker  . Smokeless tobacco: Never Used  Substance Use Topics  . Alcohol use: Yes  . Drug use: Never    Review of Systems Constitutional: No fever Cardiovascular: Denies chest pain. Respiratory: Denies shortness of breath. Gastrointestinal: No abdominal pain.  No nausea, no vomiting.  No diarrhea.   Genitourinary: Positive for dysuria. Musculoskeletal: Negative for back pain. Skin: Negative for rash. ____________________________________________   PHYSICAL EXAM:  VITAL SIGNS: ED Triage Vitals  Enc Vitals Group     BP 01/22/20 1252 110/72     Pulse Rate 01/22/20 1252 (!) 104     Resp 01/22/20 1252 14     Temp 01/22/20 1252 98.2 F (36.8 C)     Temp Source 01/22/20 1252 Oral     SpO2 01/22/20 1252 98 %  Weight 01/22/20 1249 110 lb (49.9 kg)     Height 01/22/20 1249 5\' 2"  (1.575 m)     Head Circumference --      Peak Flow --      Pain Score 01/22/20 1249 6     Pain Loc --      Pain Edu? --      Excl. in GC? --     Constitutional: Alert and oriented. Well appearing and in no acute distress. Eyes: Conjunctivae are normal.  ENT      Head: Normocephalic and atraumatic. Cardiovascular: Normal rate, regular rhythm. Grossly normal heart sounds.   Good peripheral circulation. Respiratory: Normal respiratory effort without tachypnea nor retractions. Breath sounds are clear and equal bilaterally. No wheezes, rales, rhonchi. Gastrointestinal: Soft and nontender. No CVA tenderness. Musculoskeletal:  No midline cervical, thoracic or lumbar tenderness to palpation. Neurologic:  Normal speech and language. Speech is normal. No gait instability.  Skin:  Skin is warm, dry and intact. No rash noted. Psychiatric: Mood and affect are normal. Speech and behavior are normal. Patient exhibits appropriate insight and judgment   ___________________________________________   LABS (all labs ordered are listed, but only abnormal results are displayed)  Labs Reviewed  URINALYSIS, COMPLETE (UACMP) WITH MICROSCOPIC - Abnormal; Notable for the following components:      Result Value   Color, Urine RED (*)    APPearance TURBID (*)    Glucose, UA   (*)    Value: TEST NOT REPORTED DUE TO COLOR INTERFERENCE OF URINE PIGMENT   Hgb urine dipstick   (*)    Value: TEST NOT REPORTED DUE TO COLOR INTERFERENCE OF URINE PIGMENT   Bilirubin Urine   (*)    Value: TEST NOT REPORTED DUE TO COLOR INTERFERENCE OF URINE PIGMENT   Ketones, ur   (*)    Value: TEST NOT REPORTED DUE TO COLOR INTERFERENCE OF URINE PIGMENT   Protein, ur   (*)    Value: TEST NOT REPORTED DUE TO COLOR INTERFERENCE OF URINE PIGMENT   Nitrite   (*)    Value: TEST NOT REPORTED DUE TO COLOR INTERFERENCE OF URINE PIGMENT   Leukocytes,Ua   (*)    Value: TEST NOT REPORTED DUE TO COLOR INTERFERENCE OF URINE PIGMENT   All other components within normal limits  WET PREP, GENITAL  URINE CULTURE    PROCEDURES Procedures   INITIAL IMPRESSION / ASSESSMENT AND PLAN / ED COURSE  Pertinent labs & imaging results that were available during my care of the patient were reviewed by me and considered in my medical decision making (see chart for details).  Well-appearing patient.  No acute distress.   Dysuria complaints with hematuria.  Denies pain or other complaints.  Urinalysis reviewed, blood present, unable to decipher other, will culture.  Concern for cystitis, will treat with oral Keflex.  Discussed very close monitoring.  Will need follow-up and further evaluation for continued hematuria.  Supportive care. Discussed indication, risks and benefits of medications with patient.   Discussed follow up and return parameters including no resolution or any worsening concerns. Patient verbalized understanding and agreed to plan.   ____________________________________________   FINAL CLINICAL IMPRESSION(S) / ED DIAGNOSES  Final diagnoses:  Dysuria  Hematuria, unspecified type     ED Discharge Orders         Ordered    cephALEXin (KEFLEX) 500 MG capsule  2 times daily     01/22/20 1338  Note: This dictation was prepared with Dragon dictation along with smaller phrase technology. Any transcriptional errors that result from this process are unintentional.         Marylene Land, NP 01/22/20 1447

## 2020-01-22 NOTE — ED Triage Notes (Signed)
Patient c/o urinary frequency, dysruria, and hematuria that started on Tuesday.  Patient states that she is on her menstrual cycle.  Patient states that she recently traveled to [place].

## 2020-01-24 LAB — URINE CULTURE: Culture: 20000 — AB

## 2020-02-05 ENCOUNTER — Other Ambulatory Visit: Payer: Self-pay

## 2020-02-05 DIAGNOSIS — N83201 Unspecified ovarian cyst, right side: Secondary | ICD-10-CM

## 2020-02-07 MED ORDER — NORGESTIM-ETH ESTRAD TRIPHASIC 0.18/0.215/0.25 MG-35 MCG PO TABS: 1.0000 | ORAL_TABLET | Freq: Every day | ORAL | 10 refills | Status: AC

## 2020-08-15 ENCOUNTER — Other Ambulatory Visit: Payer: Self-pay

## 2020-08-15 ENCOUNTER — Ambulatory Visit
Admission: EM | Admit: 2020-08-15 | Discharge: 2020-08-15 | Disposition: A | Discharge status: Home / Self Care | Payer: BC Managed Care – PPO | Attending: Physician Assistant | Admitting: Physician Assistant

## 2020-08-15 DIAGNOSIS — J45901 Unspecified asthma with (acute) exacerbation: Secondary | ICD-10-CM | POA: Diagnosis not present

## 2020-08-15 DIAGNOSIS — Z79899 Other long term (current) drug therapy: Secondary | ICD-10-CM | POA: Diagnosis not present

## 2020-08-15 DIAGNOSIS — B349 Viral infection, unspecified: Secondary | ICD-10-CM | POA: Diagnosis present

## 2020-08-15 DIAGNOSIS — R059 Cough, unspecified: Secondary | ICD-10-CM | POA: Insufficient documentation

## 2020-08-15 DIAGNOSIS — Z20822 Contact with and (suspected) exposure to covid-19: Secondary | ICD-10-CM | POA: Diagnosis not present

## 2020-08-15 LAB — RESP PANEL BY RT-PCR (FLU A&B, COVID) ARPGX2
Influenza A by PCR: NEGATIVE
Influenza B by PCR: NEGATIVE
SARS Coronavirus 2 by RT PCR: NEGATIVE

## 2020-08-15 MED ORDER — ALBUTEROL SULFATE HFA 108 (90 BASE) MCG/ACT IN AERS: 1.0000 | INHALATION_SPRAY | Freq: Four times a day (QID) | RESPIRATORY_TRACT | 1 refills | Status: AC | PRN

## 2020-08-15 MED ORDER — PSEUDOEPH-BROMPHEN-DM 30-2-10 MG/5ML PO SYRP: 10.0000 mL | ORAL_SOLUTION | Freq: Four times a day (QID) | ORAL | 0 refills | Status: AC | PRN

## 2020-08-15 MED ORDER — PREDNISONE 20 MG PO TABS: 40.0000 mg | ORAL_TABLET | Freq: Every day | ORAL | 0 refills | Status: AC

## 2020-08-15 NOTE — ED Triage Notes (Signed)
Pt c/o congestion, runny nose with clear secretions, fatigue onset 12/24, Productive cough 2/25 onset. Pt also c/o SOB, "wheezing", has been using MDI albuterol-last used last night. Reports irritated sore throat that pt attributes to coughing.  Denies fever, n/v/d, ear pain, loss of taste/smell.  Bilateral lungs CTA.

## 2020-08-15 NOTE — ED Provider Notes (Signed)
[facility]    CSN: [id number] Arrival date & time: 08/15/20  1217      History   Chief Complaint Chief Complaint  Patient presents with  . Nasal Congestion  . Sore Throat    HPI Victoria James is a 24 y.o. female presenting for onset of nasal congestion/runny nose, fatigue, productive cough, shortness of breath and wheezing x ~4 days.  Patient does have history of asthma and has been using her albuterol inhaler.  She says that she only has a couple of puffs left in the inhaler and would need a refill.  Patient says she has been having to get up in the evenings and use her inhaler, which she does not normally have to do.  She denies any chest pain or tightness.  Denies any fevers or weakness. Patient denies any known exposure to COVID-19 has been fully vaccinated for COVID-19.  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  She has no other complaints or concerns at this time.  HPI  Past Medical History:  Diagnosis Date  . Asthma     Patient Active Problem List   Diagnosis Date Noted  . Ovarian cyst 11/08/2019    History reviewed. No pertinent surgical history.  OB History   No obstetric history on file.      Home Medications    Prior to Admission medications   Medication Sig Start Date End Date Taking? Authorizing Provider  brompheniramine-pseudoephedrine-DM 30-2-10 MG/5ML syrup Take 10 mLs by mouth 4 (four) times daily as needed for up to 7 days. 08/15/20 08/22/20 Yes Shirlee LatchEaves, Ariyan Sinnett B, PA-C  Norgestimate-Ethinyl Estradiol Triphasic (TRI-SPRINTEC) 0.18/0.215/0.25 MG-35 MCG tablet Take 1 tablet by mouth at bedtime for 28 days. 02/07/20 03/06/20 Yes Linzie CollinEvans, David James, MD  predniSONE (DELTASONE) 20 MG tablet Take 2 tablets (40 mg total) by mouth daily for 5 days. 08/15/20 08/20/20 Yes Shirlee LatchEaves, Lucee Brissett B, PA-C  acetaminophen (TYLENOL) 500 MG tablet Take 500 mg by mouth every 6 (six) hours as needed for moderate  pain.    [provider]  albuterol (VENTOLIN HFA) 108 (90 Base) MCG/ACT inhaler Inhale 1-2 puffs into the lungs every 6 (six) hours as needed for wheezing or shortness of breath. 08/15/20 08/15/21  Eusebio FriendlyEaves, Terral Cooks B, PA-C  Clindamycin Phos-Benzoyl Perox (ONEXTON) 1.2-3.75 % GEL Apply 1 application topically daily.  06/30/19   [provider]  ibuprofen (ADVIL) 200 MG tablet Take 200 mg by mouth every 6 (six) hours as needed.    [provider]  Tazarotene (ARAZLO) 0.045 % LOTN Apply 1 application topically at bedtime.  06/30/19   [provider]    Family History Family History  Problem Relation Age of Onset  . Lupus Mother   . Diabetes Mother   . Thyroid disease Mother   . Thyroid disease Father     Social History Social History   Tobacco Use  . Smoking status: Never Smoker  . Smokeless tobacco: Never Used  Vaping Use  . Vaping Use: Never used  Substance Use Topics  . Alcohol use: Yes  . Drug use: Never     Allergies   Patient has no known allergies.   Review of Systems Review of Systems  Constitutional: Positive for fatigue. Negative for chills, diaphoresis and fever.  HENT: Positive for congestion, rhinorrhea and sore throat. Negative for ear pain, sinus pressure and sinus pain.   Respiratory: Positive for cough, shortness of breath  and wheezing. Negative for chest tightness.   Cardiovascular: Negative for chest pain.  Gastrointestinal: Negative for abdominal pain, nausea and vomiting.  Musculoskeletal: Negative for arthralgias and myalgias.  Skin: Negative for rash.  Neurological: Negative for weakness and headaches.  Hematological: Negative for adenopathy.     Physical Exam Triage Vital Signs ED Triage Vitals  Enc Vitals Group     BP 08/15/20 1455 109/81     Pulse Rate 08/15/20 1455 70     Resp 08/15/20 1455 18     Temp 08/15/20 1455 98.2 F (36.8 C)     Temp Source 08/15/20 1455 Oral     SpO2 08/15/20 1455 100 %      Weight --      Height --      Head Circumference --      Peak Flow --      Pain Score 08/15/20 1451 2     Pain Loc --      Pain Edu? --      Excl. in GC? --    No data found.  Updated Vital Signs BP 109/81 (BP Location: Left Arm)   Pulse 70   Temp 98.2 F (36.8 C) (Oral)   Resp 18   LMP 07/26/2020   SpO2 100%        Physical Exam Vitals and nursing note reviewed.  Constitutional:      General: She is not in acute distress.    Appearance: Normal appearance. She is not ill-appearing or toxic-appearing.  HENT:     Head: Normocephalic and atraumatic.     Nose: Congestion and rhinorrhea present.     Mouth/Throat:     Mouth: Mucous membranes are moist.     Pharynx: Oropharynx is clear. Posterior oropharyngeal erythema (mild) present.  Eyes:     General: No scleral icterus.       Right eye: No discharge.        Left eye: No discharge.     Conjunctiva/sclera: Conjunctivae normal.  Cardiovascular:     Rate and Rhythm: Normal rate and regular rhythm.     Heart sounds: Normal heart sounds.  Pulmonary:     Effort: Pulmonary effort is normal. No respiratory distress.     Breath sounds: Normal breath sounds.  Musculoskeletal:     Cervical back: Neck supple.  Skin:    General: Skin is dry.  Neurological:     General: No focal deficit present.     Mental Status: She is alert. Mental status is at baseline.     Motor: No weakness.     Gait: Gait normal.  Psychiatric:        Mood and Affect: Mood normal.        Behavior: Behavior normal.        Thought Content: Thought content normal.      UC Treatments / Results  Labs (all labs ordered are listed, but only abnormal results are displayed) Labs Reviewed  RESP PANEL BY RT-PCR (FLU A&B, COVID) [id number]    EKG   Radiology No results found.  Procedures Procedures (including critical care time)  Medications Ordered in UC Medications - No data to display  Initial Impression / Assessment and Plan / UC Course  I  have reviewed the triage vital signs and the nursing notes.  Pertinent labs & imaging results that were available during my care of the patient were reviewed by me and considered in my medical decision making (see chart for details).  All vital signs are normal and stable.  Patient's chest is clear to auscultation.  No respiratory distress or other acute distress.  Respiratory panel obtained and results are all negative.  Discussed results with the patient.  Advised her she likely has another viral illness.  Advised supportive care with increasing rest and fluids.  Sent Bromfed to pharmacy.  Refilled her albuterol inhaler and also sent prednisone since she is have been having to use her albuterol inhaler nightly.  ED precautions reviewed with patient.  Final Clinical Impressions(s) / UC Diagnoses   Final diagnoses:  Viral illness  Cough  Asthma with acute exacerbation, unspecified asthma severity, unspecified whether persistent     Discharge Instructions     URI/COLD SYMPTOMS: Your exam today is consistent with a viral illness. Antibiotics are not indicated at this time. Use medications as directed, including cough syrup, nasal saline, and decongestants. Your symptoms should improve over the next few days and resolve within 7-10 days. Increase rest and fluids. F/u if symptoms worsen or predominate such as sore throat, ear pain, productive cough, shortness of breath, or if you develop high fevers or worsening fatigue over the next several days.    You have received COVID testing today either for positive exposure, concerning symptoms that could be related to COVID infection, screening purposes, or re-testing after confirmed positive.  Your test obtained today checks for active viral infection in the last 1-2 weeks. If your test is negative now, you can still test positive later. So, if you do develop symptoms you should either get re-tested and/or isolate x 10 days. Please follow CDC  guidelines.  While Rapid antigen tests come back in 15-20 minutes, send out PCR/molecular test results typically come back within 24 hours. In the mean time, if you are symptomatic, assume this could be a positive test and treat/monitor yourself as if you do have COVID.   We will call with test results. Please download the MyChart app and set up a profile to access test results.   If symptomatic, go home and rest. Push fluids. Take Tylenol as needed for discomfort. Gargle warm salt water. Throat lozenges. Take Mucinex DM or Robitussin for cough. Humidifier in bedroom to ease coughing. Warm showers. Also review the COVID handout for more information.  COVID-19 INFECTION: The incubation period of COVID-19 is approximately 14 days after exposure, with most symptoms developing in roughly 4-5 days. Symptoms may range in severity from mild to critically severe. Roughly 80% of those infected will have mild symptoms. People of any age may become infected with COVID-19 and have the ability to transmit the virus. The most common symptoms include: fever, fatigue, cough, body aches, headaches, sore throat, nasal congestion, shortness of breath, nausea, vomiting, diarrhea, changes in smell and/or taste.    COURSE OF ILLNESS Some patients may begin with mild disease which can progress quickly into critical symptoms. If your symptoms are worsening please call ahead to the Emergency Department and proceed there for further treatment. Recovery time appears to be roughly 1-2 weeks for mild symptoms and 3-6 weeks for severe disease.   GO IMMEDIATELY TO ER FOR FEVER YOU ARE UNABLE TO GET DOWN WITH TYLENOL, BREATHING PROBLEMS, CHEST PAIN, FATIGUE, LETHARGY, INABILITY TO EAT OR DRINK, ETC  QUARANTINE AND ISOLATION: To help decrease the spread of COVID-19 please remain isolated if you have COVID infection or are highly suspected to have COVID infection. This means -stay home and isolate to one room in the home if you live  with others. Do not share a bed or bathroom with others while ill, sanitize and wipe down all countertops and keep common areas clean and disinfected. You may discontinue isolation if you have a mild case and are asymptomatic 10 days after symptom onset as long as you have been fever free >24 hours without having to take Motrin or Tylenol. If your case is more severe (meaning you develop pneumonia or are admitted in the hospital), you may have to isolate longer.   If you have been in close contact (within 6 feet) of someone diagnosed with COVID 19, you are advised to quarantine in your home for 14 days as symptoms can develop anywhere from 2-14 days after exposure to the virus. If you develop symptoms, you  must isolate.  Most current guidelines for COVID after exposure -isolate 10 days if you ARE NOT tested for COVID as long as symptoms do not develop -isolate 7 days if you are tested and remain asymptomatic -You do not necessarily need to be tested for COVID if you have + exposure and        develop   symptoms. Just isolate at home x10 days from symptom onset During this global pandemic, CDC advises to practice social distancing, try to stay at least 25ft away from others at all times. Wear a face covering. Wash and sanitize your hands regularly and avoid going anywhere that is not necessary.  KEEP IN MIND THAT THE COVID TEST IS NOT 100% ACCURATE AND YOU SHOULD STILL DO EVERYTHING TO PREVENT POTENTIAL SPREAD OF VIRUS TO OTHERS (WEAR MASK, WEAR GLOVES, WASH HANDS AND SANITIZE REGULARLY). IF INITIAL TEST IS NEGATIVE, THIS MAY NOT MEAN YOU ARE DEFINITELY NEGATIVE. MOST ACCURATE TESTING IS DONE 5-7 DAYS AFTER EXPOSURE.   It is not advised by CDC to get re-tested after receiving a positive COVID test since you can still test positive for weeks to months after you have already cleared the virus.   *If you have not been vaccinated for COVID, I strongly suggest you consider getting vaccinated as long as there  are no contraindications.      ED Prescriptions    Medication Sig Dispense Auth. Provider   albuterol (VENTOLIN HFA) 108 (90 Base) MCG/ACT inhaler Inhale 1-2 puffs into the lungs every 6 (six) hours as needed for wheezing or shortness of breath. 1 each Shirlee Latch, PA-C   brompheniramine-pseudoephedrine-DM 30-2-10 MG/5ML syrup Take 10 mLs by mouth 4 (four) times daily as needed for up to 7 days. 150 mL Eusebio Friendly B, PA-C   predniSONE (DELTASONE) 20 MG tablet Take 2 tablets (40 mg total) by mouth daily for 5 days. 10 tablet Gareth Morgan     PDMP not reviewed this encounter.   Shirlee Latch, PA-C 08/15/20 1610

## 2020-08-15 NOTE — Discharge Instructions (Addendum)

## 2020-08-22 ENCOUNTER — Ambulatory Visit (INDEPENDENT_AMBULATORY_CARE_PROVIDER_SITE_OTHER): Payer: BC Managed Care – PPO

## 2020-08-22 ENCOUNTER — Ambulatory Visit
Admission: EM | Admit: 2020-08-22 | Discharge: 2020-08-22 | Disposition: A | Discharge status: Home / Self Care | Payer: BC Managed Care – PPO | Attending: Family Medicine | Admitting: Family Medicine

## 2020-08-22 ENCOUNTER — Other Ambulatory Visit: Payer: Self-pay

## 2020-08-22 DIAGNOSIS — M7918 Myalgia, other site: Secondary | ICD-10-CM | POA: Diagnosis not present

## 2020-08-22 DIAGNOSIS — M25531 Pain in right wrist: Secondary | ICD-10-CM | POA: Diagnosis not present

## 2020-08-22 MED ORDER — MELOXICAM 15 MG PO TABS: 15.0000 mg | ORAL_TABLET | Freq: Every day | ORAL | 0 refills | Status: DC | PRN

## 2020-08-22 NOTE — Discharge Instructions (Signed)
Rest.  Heat.  Medication as prescribed.  Take care  Dr. Shaylon Aden  
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
## 2020-09-12 ENCOUNTER — Other Ambulatory Visit: Payer: Self-pay

## 2020-09-12 ENCOUNTER — Encounter: Payer: Self-pay | Admitting: Emergency Medicine

## 2020-09-12 ENCOUNTER — Ambulatory Visit
Admission: EM | Admit: 2020-09-12 | Discharge: 2020-09-12 | Disposition: A | Discharge status: Home / Self Care | Payer: BC Managed Care – PPO | Attending: Family Medicine | Admitting: Family Medicine

## 2020-09-12 DIAGNOSIS — U071 COVID-19: Secondary | ICD-10-CM | POA: Diagnosis not present

## 2020-09-12 MED ORDER — PREDNISONE 50 MG PO TABS: ORAL_TABLET | ORAL | 0 refills | Status: DC

## 2020-09-12 NOTE — ED Triage Notes (Signed)
Cough, body aches and fatigue x 2 days.

## 2020-09-12 NOTE — Discharge Instructions (Signed)
Medication as prescribed.  Stay home.  Check my chart for COVID test results.  Take care  Dr. Anique Beckley   

## 2020-09-12 NOTE — ED Provider Notes (Signed)
[facility]    CSN: [id number] Arrival date & time: 09/12/20  1712  History   Chief Complaint Chief Complaint  Patient presents with  . Generalized Body Aches  . Cough   HPI  25 year old female presents with the above complaints.   Patient reports that she feels like her symptoms started today.  She reports sore throat, cough, body aches.  Denies fever.  Denies shortness of breath.  She took home Covid testing was positive.  She is had a recent exposure.  No relieving factors.  Patient concerned given the positive Covid test results at home in the setting of her asthma.  She would like to be examined today.  No other associated symptoms.  No other complaints.  Past Medical History:  Diagnosis Date  . Asthma    Patient Active Problem List   Diagnosis Date Noted  . Ovarian cyst 11/08/2019   Home Medications    Prior to Admission medications   Medication Sig Start Date End Date Taking? Authorizing Provider  albuterol (VENTOLIN HFA) 108 (90 Base) MCG/ACT inhaler Inhale 1-2 puffs into the lungs every 6 (six) hours as needed for wheezing or shortness of breath. 08/15/20 08/15/21 Yes Shirlee Latch, PA-C  predniSONE (DELTASONE) 50 MG tablet 1 tablet daily x 5 days 09/12/20  Yes Kimari Coudriet G, DO  Norgestimate-Ethinyl Estradiol Triphasic (TRI-SPRINTEC) 0.18/0.215/0.25 MG-35 MCG tablet Take 1 tablet by mouth at bedtime for 28 days. 02/07/20 03/06/20  [name], MD    Family History Family History  Problem Relation Age of Onset  . Lupus Mother   . Diabetes Mother   . Thyroid disease Mother   . Thyroid disease Father     Social History Social History   Tobacco Use  . Smoking status: Never Smoker  . Smokeless tobacco: Never Used  Vaping Use  . Vaping Use: Never used  Substance Use Topics  . Alcohol use: Yes  . Drug use: Never     Allergies   Patient has no known allergies.   Review of Systems Review of Systems  Constitutional: Positive for  fatigue. Negative for fever.  HENT: Positive for sore throat.   Respiratory: Positive for cough.   Musculoskeletal:       Body aches.    Physical Exam Triage Vital Signs ED Triage Vitals  Enc Vitals Group     BP 09/12/20 1812 116/72     Pulse Rate 09/12/20 1812 69     Resp 09/12/20 1812 16     Temp 09/12/20 1812 98.3 F (36.8 C)     Temp Source 09/12/20 1812 Oral     SpO2 09/12/20 1812 100 %     Weight --      Height --      Head Circumference --      Peak Flow --      Pain Score 09/12/20 1807 6     Pain Loc --      Pain Edu? --      Excl. in GC? --    Updated Vital Signs BP 116/72 (BP Location: Left Arm)   Pulse 69   Temp 98.3 F (36.8 C) (Oral)   Resp 16   LMP 08/23/2020   SpO2 100%   Visual Acuity Right Eye Distance:   Left Eye Distance:   Bilateral Distance:    Right Eye Near:   Left Eye Near:    Bilateral Near:     Physical Exam Vitals and nursing note  reviewed.  Constitutional:      General: She is not in acute distress.    Appearance: Normal appearance. She is not ill-appearing.  HENT:     Head: Normocephalic and atraumatic.  Eyes:     General:        Right eye: No discharge.        Left eye: No discharge.     Conjunctiva/sclera: Conjunctivae normal.  Cardiovascular:     Rate and Rhythm: Normal rate and regular rhythm.  Pulmonary:     Effort: Pulmonary effort is normal.     Breath sounds: Normal breath sounds. No wheezing, rhonchi or rales.  Neurological:     Mental Status: She is alert.  Psychiatric:        Mood and Affect: Mood normal.        Behavior: Behavior normal.    UC Treatments / Results  Labs (all labs ordered are listed, but only abnormal results are displayed) Labs Reviewed  SARS CORONAVIRUS 2 (TAT 6-24 HRS)    EKG   Radiology No results found.  Procedures Procedures (including critical care time)  Medications Ordered in UC Medications - No data to display  Initial Impression / Assessment and Plan / UC Course   I have reviewed the triage vital signs and the nursing notes.  Pertinent labs & imaging results that were available during my care of the patient were reviewed by me and considered in my medical decision making (see chart for details).    25 year old female presents with COVID-19. Well-appearing at this time. Has a history of asthma. Rx sent for prednisone to be used if needed. Albuterol as needed. Supportive care. Work note given.  Final Clinical Impressions(s) / UC Diagnoses   Final diagnoses:  COVID     Discharge Instructions     Medication as prescribed.  Stay home.  Check my chart for COVID test results.  Take care  Dr. Adriana Simas     ED Prescriptions    Medication Sig Dispense Auth. Provider   predniSONE (DELTASONE) 50 MG tablet 1 tablet daily x 5 days 5 tablet Everlene Other G, DO     PDMP not reviewed this encounter.   Tommie Sams, [place] 09/12/20 1949

## 2020-09-12 NOTE — ED Triage Notes (Signed)
Pt presents today with cough, body aches and fatigue. Requests Covid test. Tested positive at home today.

## 2020-09-13 LAB — SARS CORONAVIRUS 2 (TAT 6-24 HRS): SARS Coronavirus 2: POSITIVE — AB

## 2021-01-22 ENCOUNTER — Encounter: Payer: Self-pay | Admitting: Emergency Medicine

## 2021-01-22 ENCOUNTER — Ambulatory Visit
Admission: EM | Admit: 2021-01-22 | Discharge: 2021-01-22 | Disposition: A | Discharge status: Home / Self Care | Payer: BC Managed Care – PPO | Attending: Physician Assistant | Admitting: Physician Assistant

## 2021-01-22 ENCOUNTER — Other Ambulatory Visit: Payer: Self-pay

## 2021-01-22 DIAGNOSIS — R1033 Periumbilical pain: Secondary | ICD-10-CM

## 2021-01-22 DIAGNOSIS — R1030 Lower abdominal pain, unspecified: Secondary | ICD-10-CM

## 2021-01-22 LAB — COMPREHENSIVE METABOLIC PANEL WITH GFR
ALT: 17 U/L (ref 0–44)
AST: 13 U/L — ABNORMAL LOW (ref 15–41)
Albumin: 3.6 g/dL (ref 3.5–5.0)
Alkaline Phosphatase: 59 U/L (ref 38–126)
Anion gap: 5 (ref 5–15)
BUN: 7 mg/dL (ref 6–20)
CO2: 25 mmol/L (ref 22–32)
Calcium: 8.7 mg/dL — ABNORMAL LOW (ref 8.9–10.3)
Chloride: 104 mmol/L (ref 98–111)
Creatinine, Ser: 0.59 mg/dL (ref 0.44–1.00)
GFR, Estimated: >60 mL/min (ref >60)
Glucose, Bld: 94 mg/dL (ref 70–99)
Potassium: 3.8 mmol/L (ref 3.5–5.1)
Sodium: 134 mmol/L — ABNORMAL LOW (ref 135–145)
Total Bilirubin: 0.8 mg/dL (ref 0.3–1.2)
Total Protein: 7.1 g/dL (ref 6.5–8.1)

## 2021-01-22 LAB — URINALYSIS, COMPLETE (UACMP) WITH MICROSCOPIC
Bilirubin Urine: NEGATIVE
Glucose, UA: 250 mg/dL — AB
Hgb urine dipstick: NEGATIVE
Ketones, ur: NEGATIVE mg/dL
Leukocytes,Ua: NEGATIVE
Nitrite: NEGATIVE
Protein, ur: NEGATIVE mg/dL
Specific Gravity, Urine: 1.015 (ref 1.005–1.030)
pH: 7 (ref 5.0–8.0)

## 2021-01-22 LAB — CBC WITH DIFFERENTIAL/PLATELET
Abs Immature Granulocytes: 0.01 K/uL (ref 0.00–0.07)
Basophils Absolute: 0.1 K/uL (ref 0.0–0.1)
Basophils Relative: 1 %
Eosinophils Absolute: 0.3 K/uL (ref 0.0–0.5)
Eosinophils Relative: 5 %
HCT: 37 % (ref 36.0–46.0)
Hemoglobin: 11.8 g/dL — ABNORMAL LOW (ref 12.0–15.0)
Immature Granulocytes: 0 %
Lymphocytes Relative: 37 %
Lymphs Abs: 2.3 K/uL (ref 0.7–4.0)
MCH: 27.4 pg (ref 26.0–34.0)
MCHC: 31.9 g/dL (ref 30.0–36.0)
MCV: 85.8 fL (ref 80.0–100.0)
Monocytes Absolute: 0.4 K/uL (ref 0.1–1.0)
Monocytes Relative: 7 %
Neutro Abs: 3.2 K/uL (ref 1.7–7.7)
Neutrophils Relative %: 50 %
Platelets: 315 K/uL (ref 150–400)
RBC: 4.31 MIL/uL (ref 3.87–5.11)
RDW: 12.5 % (ref 11.5–15.5)
WBC: 6.2 K/uL (ref 4.0–10.5)
nRBC: 0 % (ref 0.0–0.2)

## 2021-01-22 LAB — WET PREP, GENITAL
Clue Cells Wet Prep HPF POC: NONE SEEN
Sperm: NONE SEEN
Trich, Wet Prep: NONE SEEN
Yeast Wet Prep HPF POC: NONE SEEN

## 2021-01-22 LAB — LIPASE, BLOOD: Lipase: 40 U/L (ref 11–51)

## 2021-01-22 LAB — PREGNANCY, URINE: Preg Test, Ur: NEGATIVE

## 2021-01-22 NOTE — ED Triage Notes (Signed)
Pt c/o umbilical abdominal pain, and right lower quadrant pain. Started about a week ago. Denies urinary symptoms or vaginal discharge.

## 2021-01-22 NOTE — ED Provider Notes (Signed)
[facility]    CSN: [id number] Arrival date & time: 01/22/21  1451      History   Chief Complaint Chief Complaint  Patient presents with  . Abdominal Pain    HPI Victoria James is a 25 y.o. female presenting for approximately 1 week history of periumbilical abdominal pain.  She says there is 1 spot specifically that is tender when it is pressed.  She also says that she has intermittent right sided lower abdominal pain.  She is not experiencing any right lower quadrant pain at this time.  Patient says her pain is not improving or getting worse over the past week.  Patient denies any other associated symptoms.  She denies fever, fatigue, change in appetite, chills, sweats, body aches, nausea/vomiting/diarrhea, constipation, dysuria, urinary frequency or urgency, abnormal vaginal discharge/itching/odor, or concern for STIs.  Denies similar problems in the past.  Patient says that she has been vegan for the past 2 years and eats very healthy.  She does have history of asthma as well as ovarian cyst.  Patient says she takes oral contraceptives and that has helped with any ovarian cyst that she has had.  She says she had a checkup with her OB/GYN a couple of months ago.  She has no other concerns today.  HPI  Past Medical History:  Diagnosis Date  . Asthma     Patient Active Problem List   Diagnosis Date Noted  . Ovarian cyst 11/08/2019    History reviewed. No pertinent surgical history.  OB History   No obstetric history on file.      Home Medications    Prior to Admission medications   Medication Sig Start Date End Date Taking? Authorizing Provider  albuterol (VENTOLIN HFA) 108 (90 Base) MCG/ACT inhaler Inhale 1-2 puffs into the lungs every 6 (six) hours as needed for wheezing or shortness of breath. 08/15/20 08/15/21  Shirlee Latch, PA-C  Norgestimate-Ethinyl Estradiol Triphasic (TRI-SPRINTEC) 0.18/0.215/0.25 MG-35 MCG tablet Take 1 tablet by mouth at bedtime  for 28 days. 02/07/20 03/06/20  [name], MD  predniSONE (DELTASONE) 50 MG tablet 1 tablet daily x 5 days 09/12/20   Tommie Sams, DO    Family History Family History  Problem Relation Age of Onset  . Lupus Mother   . Diabetes Mother   . Thyroid disease Mother   . Thyroid disease Father     Social History Social History   Tobacco Use  . Smoking status: Never Smoker  . Smokeless tobacco: Never Used  Vaping Use  . Vaping Use: Never used  Substance Use Topics  . Alcohol use: Yes  . Drug use: Never     Allergies   Patient has no known allergies.   Review of Systems Review of Systems  Constitutional: Negative for appetite change, fatigue and fever.  Cardiovascular: Negative for chest pain.  Gastrointestinal: Positive for abdominal pain. Negative for constipation, diarrhea, nausea and vomiting.  Genitourinary: Negative for difficulty urinating, dysuria, flank pain, pelvic pain, vaginal discharge and vaginal pain.  Musculoskeletal: Negative for back pain.  Skin: Negative for color change and rash.  Neurological: Negative for weakness.     Physical Exam Triage Vital Signs ED Triage Vitals  Enc Vitals Group     BP 01/22/21 1506 134/90     Pulse Rate 01/22/21 1506 (!) 103     Resp 01/22/21 1506 18     Temp 01/22/21 1506 98.4 F (36.9 C)     Temp Source  01/22/21 1506 Oral     SpO2 01/22/21 1506 100 %     Weight 01/22/21 1507 106 lb 0.7 oz (48.1 kg)     Height 01/22/21 1507 5\' 2"  (1.575 m)     Head Circumference --      Peak Flow --      Pain Score 01/22/21 1506 7     Pain Loc --      Pain Edu? --      Excl. in GC? --    No data found.  Updated Vital Signs BP 134/90 (BP Location: Right Arm)   Pulse (!) 103   Temp 98.4 F (36.9 C) (Oral)   Resp 18   Ht 5\' 2"  (1.575 m)   Wt 106 lb 0.7 oz (48.1 kg)   LMP 01/12/2021 (Approximate)   SpO2 100%   BMI 19.40 kg/m       Physical Exam Vitals and nursing note reviewed.  Constitutional:      General:  She is not in acute distress.    Appearance: Normal appearance. She is not ill-appearing or toxic-appearing.  HENT:     Head: Normocephalic and atraumatic.     Nose: Nose normal.     Mouth/Throat:     Mouth: Mucous membranes are moist.     Pharynx: Oropharynx is clear.  Eyes:     General: No scleral icterus.       Right eye: No discharge.        Left eye: No discharge.     Conjunctiva/sclera: Conjunctivae normal.  Cardiovascular:     Rate and Rhythm: Regular rhythm. Tachycardia present.     Heart sounds: Normal heart sounds.  Pulmonary:     Effort: Pulmonary effort is normal. No respiratory distress.     Breath sounds: Normal breath sounds.  Abdominal:     Palpations: Abdomen is soft.     Tenderness: There is abdominal tenderness (TTP periumbilical and suprapubic).  Musculoskeletal:     Cervical back: Neck supple.  Skin:    General: Skin is dry.  Neurological:     General: No focal deficit present.     Mental Status: She is alert. Mental status is at baseline.     Motor: No weakness.     Gait: Gait normal.  Psychiatric:        Mood and Affect: Mood normal.        Behavior: Behavior normal.        Thought Content: Thought content normal.      UC Treatments / Results  Labs (all labs ordered are listed, but only abnormal results are displayed) Labs Reviewed  WET PREP, GENITAL - Abnormal; Notable for the following components:      Result Value   WBC, Wet Prep HPF POC FEW (*)    All other components within normal limits  URINALYSIS, COMPLETE (UACMP) WITH MICROSCOPIC - Abnormal; Notable for the following components:   Glucose, UA 250 (*)    Bacteria, UA FEW (*)    All other components within normal limits  COMPREHENSIVE METABOLIC PANEL - Abnormal; Notable for the following components:   Sodium 134 (*)    Calcium 8.7 (*)    AST 13 (*)    All other components within normal limits  CBC WITH DIFFERENTIAL/PLATELET - Abnormal; Notable for the following components:    Hemoglobin 11.8 (*)    All other components within normal limits  PREGNANCY, URINE  LIPASE, BLOOD    EKG   Radiology No results  found.  Procedures Procedures (including critical care time)  Medications Ordered in UC Medications - No data to display  Initial Impression / Assessment and Plan / UC Course  I have reviewed the triage vital signs and the nursing notes.  Pertinent labs & imaging results that were available during my care of the patient were reviewed by me and considered in my medical decision making (see chart for details).   26 year old female presenting for periumbilical, suprapubic and right lower quadrant pain over the past week.  Denies any other associated symptoms.  Vital signs are all normal and stable and she is overall well-appearing.  Exam significant for periumbilical tenderness and mild suprapubic tenderness.  No guarding or rebound.  No tenderness of the right lower quadrant.  Chest is clear to auscultation heart regular rate and rhythm.  Urinalysis today is not consistent with UTI.  Pregnancy test is negative.  CBC does not show an elevated white blood cell count or other significant abnormality.  Lipase is normal.  Wet prep does not show a yeast infection, trichomonas infection or BV infection.  CMP is unrevealing.  Reviewed all results with patient.  Advised patient I am not concerned about any severe abdominal problem at this point and she does not need any imaging based on her labs right now.  However, if symptoms worsen or continue she may need an ultrasound, especially since she has a history of ovarian cysts and she is complaining of pain in the right lower quadrant that comes and goes.  Advised her to follow-up with OB/GYN if this persists.  I did review ED red flag signs and symptoms related to abdominal pain with patient.  Advised to go to ED if her abdominal pain worsens or continues after the next couple of days or if she has any associated appetite  changes, vomiting, constipation or diarrhea, urinary symptoms, etc.  Patient agreeable.   Final Clinical Impressions(s) / UC Diagnoses   Final diagnoses:  Lower abdominal pain  Periumbilical pain     Discharge Instructions     Your labs are all reassuring.  I do not suspect a significant abdominal problem requiring ER visit or imaging at this time.  At this time I would advise increasing rest and fluids and taking Tylenol/ibuprofen as needed for discomfort.  You are advised to follow-up with PCP or OB/GYN if symptoms continue, but if the worsen or are associated with any of the symptoms we discussed then he should go to the emergency department.    ED Prescriptions    None     PDMP not reviewed this encounter.   Shirlee Latch, PA-C 01/22/21 [phone]

## 2021-01-22 NOTE — Discharge Instructions (Addendum)
Your labs are all reassuring.  I do not suspect a significant abdominal problem requiring ER visit or imaging at this time.  At this time I would advise increasing rest and fluids and taking Tylenol/ibuprofen as needed for discomfort.  You are advised to follow-up with PCP or OB/GYN if symptoms continue, but if the worsen or are associated with any of the symptoms we discussed then he should go to the emergency department.

## 2021-05-01 IMAGING — CT CT ABD-PELV W/ CM
2 of 4 series · 15 of 46 positions shown, 17 images · IV contrast (APPLIED)
Comparison: None.

CLINICAL DATA: Abdominal pain radiating to back.

EXAM:
CT ABDOMEN AND PELVIS WITH CONTRAST
TECHNIQUE: Multidetector CT imaging of the abdomen and pelvis was performed
using the standard protocol following bolus administration of
intravenous contrast.
CONTRAST:  75mL OMNIPAQUE IOHEXOL 300 MG/ML  SOLN

[Series 2: routine abd/pel with · axial · 0.58mm/px · z∈[-848,-478]mm · 12 of 85 slices shown, 14 images]
[im 7/85  soft-tissue]
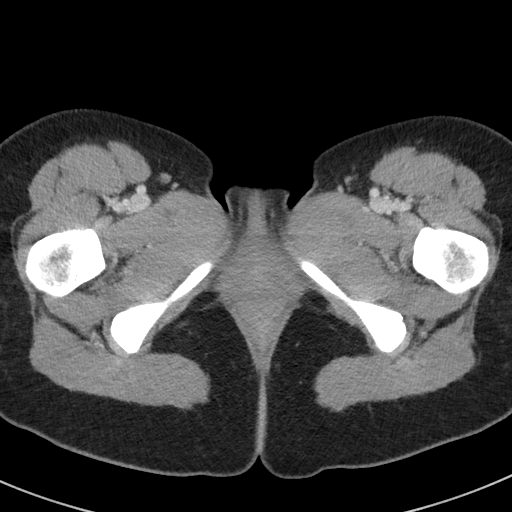
[im 7/85  bone]
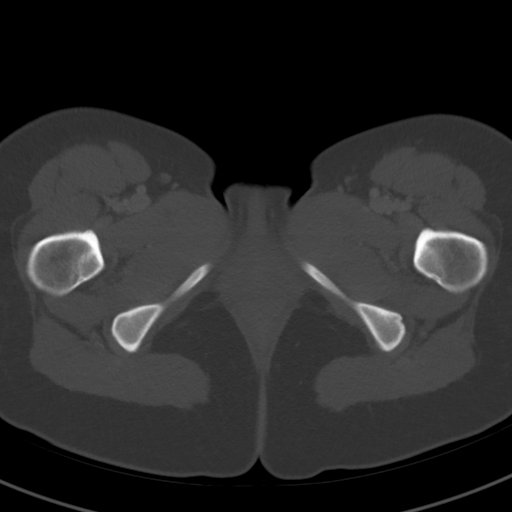
[im 14/85  soft-tissue]
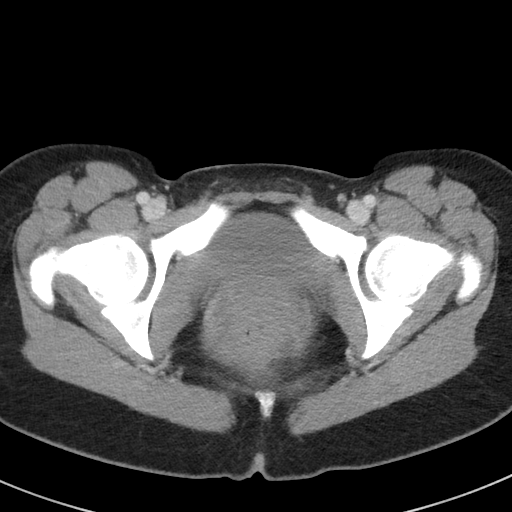
[im 21/85  soft-tissue]
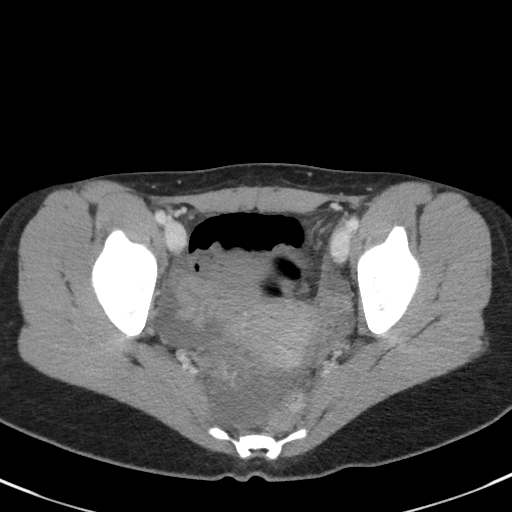
[im 27/85  soft-tissue]
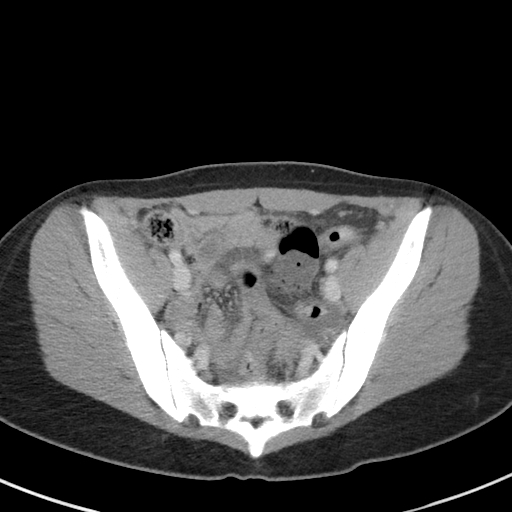
[im 34/85  soft-tissue]
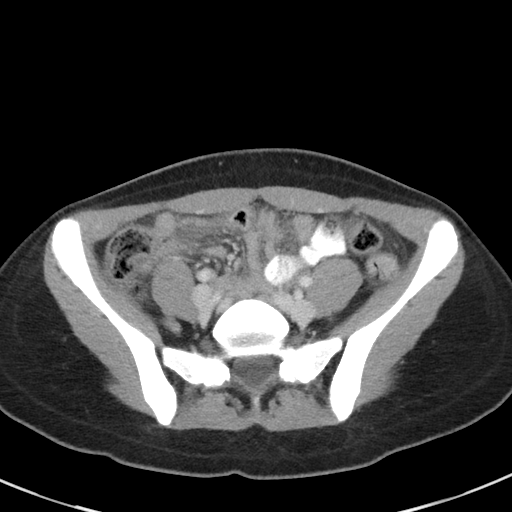
[im 41/85  soft-tissue]
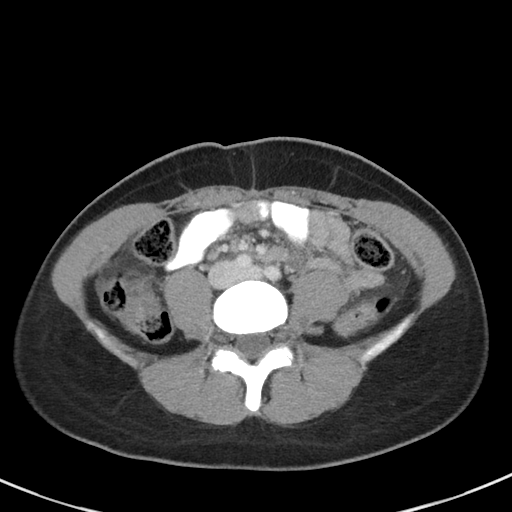
[im 48/85  soft-tissue]
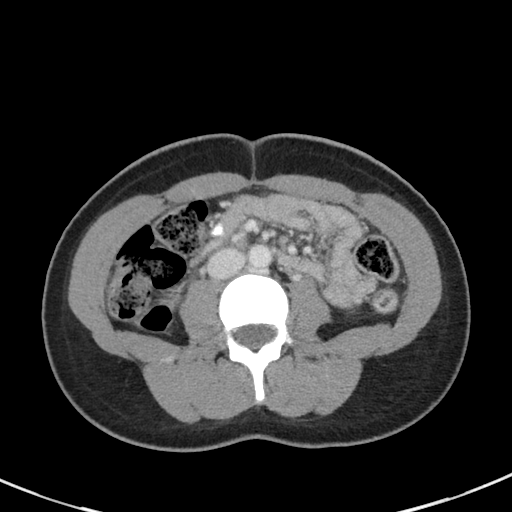
[im 54/85  soft-tissue]
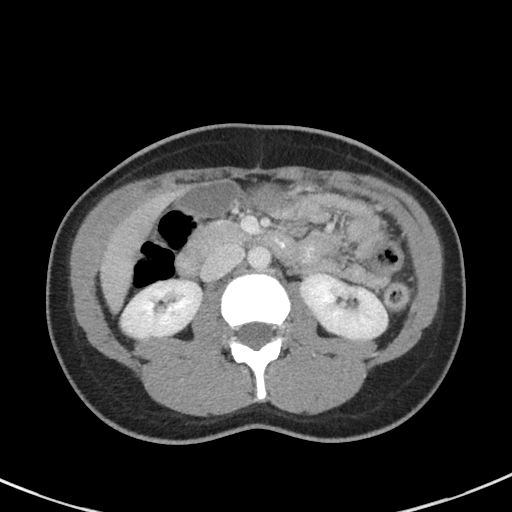
[im 61/85  soft-tissue]
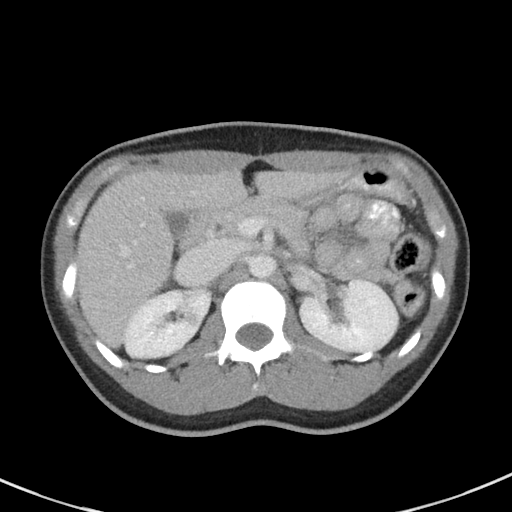
[im 61/85  bone]
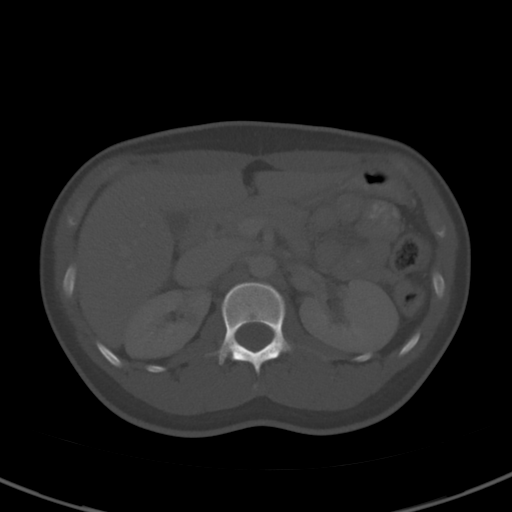
[im 68/85  soft-tissue]
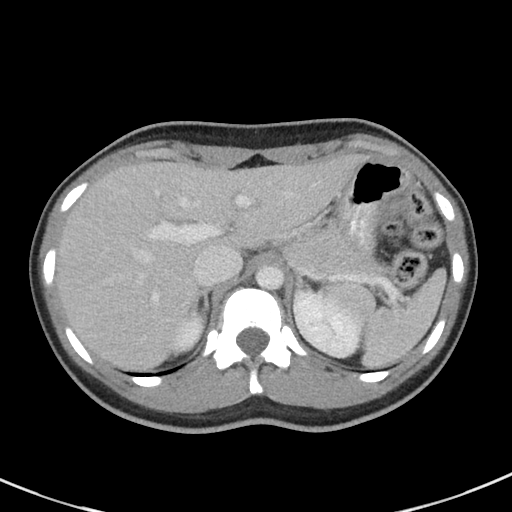
[im 74/85  soft-tissue]
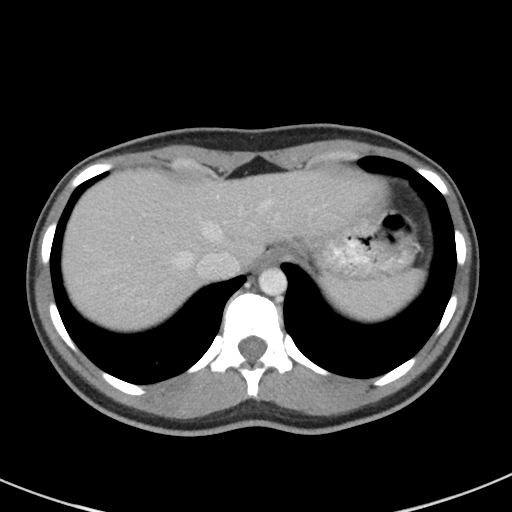
[im 81/85  soft-tissue]
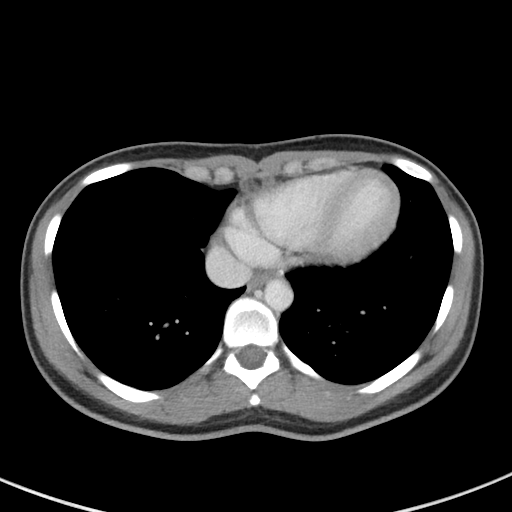

[Series 5: coronal st · coronal · 0.62mm/px · 3 of 67 slices shown]
[im 23/67  soft-tissue]
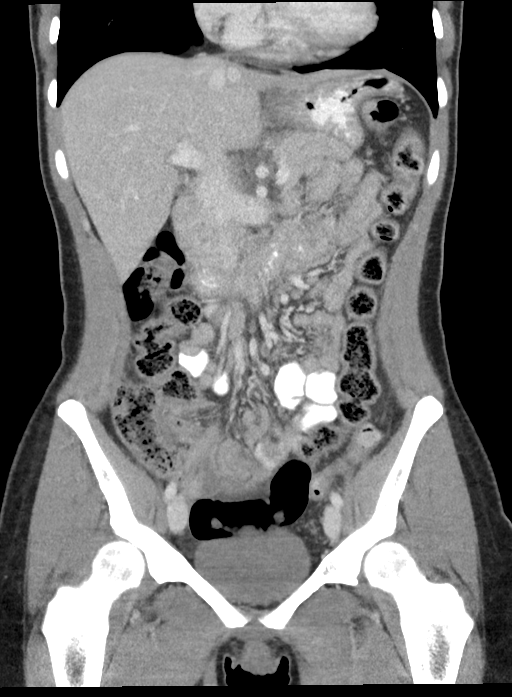
[im 30/67  soft-tissue]
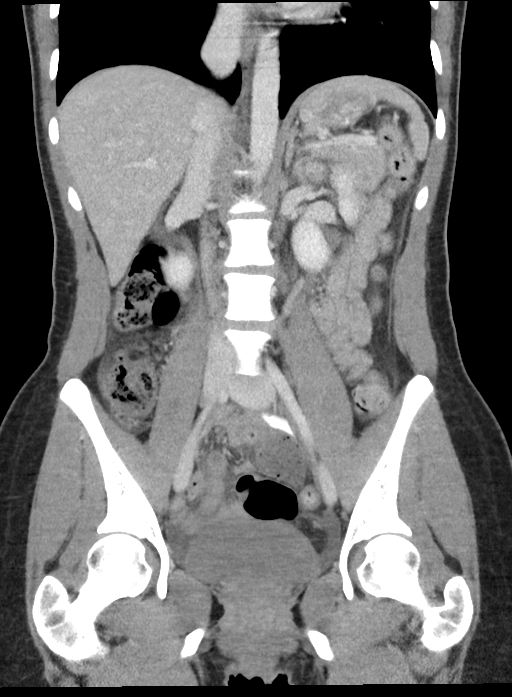
[im 37/67  soft-tissue]
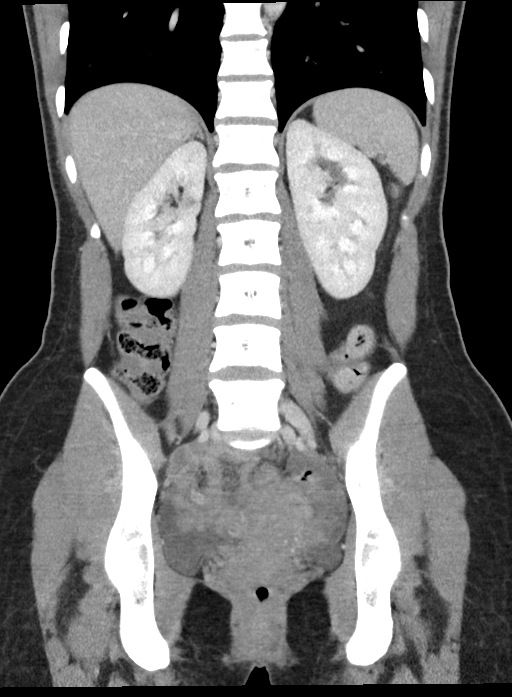

[15 of 46 positions shown; findings below may reference images not displayed]

FINDINGS: Lower chest: The lung bases are clear. The heart size is normal.

Hepatobiliary: The liver is normal. Normal gallbladder.There is no
biliary ductal dilation.

Pancreas: Normal contours without ductal dilatation. No
peripancreatic fluid collection.

Spleen: No splenic laceration or hematoma.

Adrenals/Urinary Tract:

--Adrenal glands: No adrenal hemorrhage.

--Right kidney/ureter: No hydronephrosis or perinephric hematoma.

--Left kidney/ureter: No hydronephrosis or perinephric hematoma.

--Urinary bladder: Unremarkable.

Stomach/Bowel:

--Stomach/Duodenum: No hiatal hernia or other gastric abnormality.
Normal duodenal course and caliber.

--Small bowel: There is a focal dilated loop of small bowel in the
patient's high midline pelvis measuring approximately 2.8 cm.

--Colon: There is some mild diffuse wall thickening of the distal
descending colon and sigmoid colon.

--Appendix: The appendix is difficult to evaluate but appears to be
normal and is felt to be found that coronal series 5, image 24
through 28.

Vascular/Lymphatic: Normal course and caliber of the major abdominal
vessels.

--No retroperitoneal lymphadenopathy.

--No mesenteric lymphadenopathy.

--No pelvic or inguinal lymphadenopathy.

Reproductive: There is a hyperdense structure in the right
hemipelvis measuring approximately 2.7 cm. This may represent the
patient's right ovary. The hyperdense material may represent
extravasated contrast. The left ovary is unremarkable. The uterus is
grossly unremarkable.

Other: There is a moderate amount of free fluid in the patient's
pelvis. The abdominal wall is normal.

Musculoskeletal. No acute displaced fractures.
IMPRESSION: 1. The appendix is difficult to entirely visualize but is favored to
be normal as detailed above.
2. Large amount of mildly complex free fluid in the patient's
pelvis. There is a 2.7 cm hyperdense structure in the patient's
right hemipelvis that may represent the patient's right ovary or a
hemorrhagic cyst, possibly with active extravasation. Further
evaluation with a transvaginal ultrasound is recommended given the
patient's reported history of back pain.
3. Focal dilated loop of small bowel in the patient's high midline
pelvis may represent a reactive or inflammatory enteritis. There is
no evidence for small bowel obstruction.

These results were called by telephone at the time of interpretation
on 11/07/2019 at [DATE] to provider the ED and were relayed to Dr.
Lenoard, Who verbally acknowledged these results.

## 2021-09-14 ENCOUNTER — Ambulatory Visit
Admission: EM | Admit: 2021-09-14 | Discharge: 2021-09-14 | Disposition: A | Discharge status: Home / Self Care | Payer: BC Managed Care – PPO | Attending: Internal Medicine | Admitting: Internal Medicine

## 2021-09-14 ENCOUNTER — Other Ambulatory Visit: Payer: Self-pay

## 2021-09-14 ENCOUNTER — Encounter: Payer: Self-pay | Admitting: Emergency Medicine

## 2021-09-14 DIAGNOSIS — R197 Diarrhea, unspecified: Secondary | ICD-10-CM | POA: Diagnosis not present

## 2021-09-14 DIAGNOSIS — N83209 Unspecified ovarian cyst, unspecified side: Secondary | ICD-10-CM | POA: Diagnosis not present

## 2021-09-14 DIAGNOSIS — B9629 Other Escherichia coli [E. coli] as the cause of diseases classified elsewhere: Secondary | ICD-10-CM | POA: Insufficient documentation

## 2021-09-14 NOTE — Discharge Instructions (Addendum)
You may take Pepto as needed for the diarrhea Also drink vegetable broth, and sports drink or electrolyte drinks

## 2021-09-14 NOTE — ED Triage Notes (Signed)
Patient c/o diarrhea about 2-3 episodes a day for the past 2-3 days.  Patient states the diarrhea starts soon after she eats.  Patient recently got back from [place] on Sunday. Patient took home covid test today and was negative.  Patient denies fevers.

## 2021-09-15 ENCOUNTER — Telehealth: Payer: Self-pay | Admitting: Family Medicine

## 2021-09-15 ENCOUNTER — Telehealth: Payer: Self-pay | Admitting: Emergency Medicine

## 2021-09-15 DIAGNOSIS — R197 Diarrhea, unspecified: Secondary | ICD-10-CM

## 2021-09-15 NOTE — Telephone Encounter (Signed)
Stool sample returned but no order available. Placing orders.  Thersa Salt DO

## 2021-09-17 ENCOUNTER — Telehealth: Payer: Self-pay

## 2021-09-17 LAB — GASTROINTESTINAL PANEL BY PCR, STOOL (REPLACES STOOL CULTURE)

## 2021-09-17 MED ORDER — SULFAMETHOXAZOLE-TRIMETHOPRIM 800-160 MG PO TABS: 1.0000 | ORAL_TABLET | Freq: Two times a day (BID) | ORAL | 0 refills | Status: AC

## 2021-09-17 NOTE — Telephone Encounter (Signed)
GI Panel + for ecoli and norovirus.  Spoke with Erle Crocker who reviewed chart.  Sending Bactrim DS take one BID x 7 days.  Pt advised of results.  Still 2 BM per day, given precautions for dehydration and to drink electrolyte/sports drinks.  RTC precautions.  Should start seeing improvement 2-3 days after abx started.  Pt verbalizes understanding and all questions answered.

## 2021-09-19 LAB — OVA + PARASITE EXAM

## 2021-09-19 LAB — O&P RESULT

## 2021-10-12 NOTE — ED Provider Notes (Signed)
[facility]    CSN: [id number] Arrival date & time: 09/14/21  1814      History   Chief Complaint Chief Complaint  Patient presents with   Diarrhea    HPI Victoria James is a 26 y.o. female who presents with diarrhea x 5 days since she returned from [place] with her husband. She goes 2-3 times per day and occurs as soon as she is done eating. Has not had N/V or fever. Had a negative home Covid test today.     Past Medical History:  Diagnosis Date   Asthma     Patient Active Problem List   Diagnosis Date Noted   Ovarian cyst 11/08/2019    History reviewed. No pertinent surgical history.  OB History   No obstetric history on file.      Home Medications    Prior to Admission medications   Medication Sig Start Date End Date Taking? Authorizing Provider  Norgestimate-Ethinyl Estradiol Triphasic (TRI-SPRINTEC) 0.18/0.215/0.25 MG-35 MCG tablet Take 1 tablet by mouth at bedtime for 28 days. 02/07/20 09/14/21 Yes [name], MD  albuterol (VENTOLIN HFA) 108 (90 Base) MCG/ACT inhaler Inhale 1-2 puffs into the lungs every 6 (six) hours as needed for wheezing or shortness of breath. 08/15/20 08/15/21  Shirlee Latch, PA-C    Family History Family History  Problem Relation Age of Onset   Lupus Mother    Diabetes Mother    Thyroid disease Mother    Thyroid disease Father     Social History Social History   Tobacco Use   Smoking status: Never   Smokeless tobacco: Never  Vaping Use   Vaping Use: Never used  Substance Use Topics   Alcohol use: Yes   Drug use: Never     Allergies   Patient has no known allergies.   Review of Systems Review of Systems  Constitutional:  Negative for chills, diaphoresis, fatigue and fever.  Gastrointestinal:  Positive for abdominal pain and diarrhea. Negative for blood in stool, nausea and vomiting.    Physical Exam Triage Vital Signs ED Triage Vitals  Enc Vitals Group     BP 09/14/21 1826 117/78      Pulse Rate 09/14/21 1826 73     Resp 09/14/21 1826 14     Temp 09/14/21 1826 98.6 F (37 C)     Temp Source 09/14/21 1826 Oral     SpO2 09/14/21 1826 98 %     Weight 09/14/21 1822 115 lb (52.2 kg)     Height 09/14/21 1822 5\' 2"  (1.575 m)     Head Circumference --      Peak Flow --      Pain Score 09/14/21 1822 0     Pain Loc --      Pain Edu? --      Excl. in GC? --    No data found.  Updated Vital Signs BP 117/78 (BP Location: Left Arm)    Pulse 73    Temp 98.6 F (37 C) (Oral)    Resp 14    Ht 5\' 2"  (1.575 m)    Wt 115 lb (52.2 kg)    LMP 08/24/2021 (Exact Date)    SpO2 98%    BMI 21.03 kg/m   Visual Acuity Right Eye Distance:   Left Eye Distance:   Bilateral Distance:    Right Eye Near:   Left Eye Near:    Bilateral Near:     Physical Exam Vitals and  nursing note reviewed.  HENT:     Right Ear: External ear normal.     Left Ear: External ear normal.  Eyes:     General: No scleral icterus.    Conjunctiva/sclera: Conjunctivae normal.  Pulmonary:     Effort: Pulmonary effort is normal.  Abdominal:     General: Bowel sounds are normal.     Palpations: Abdomen is soft. There is no mass.     Tenderness: There is abdominal tenderness. There is no guarding or rebound.     Comments: Had mild generalized tenderness on lower abdomen  Musculoskeletal:        General: Normal range of motion.     Cervical back: Neck supple.  Skin:    General: Skin is warm and dry.     Findings: No rash.  Neurological:     Mental Status: She is alert and oriented to person, place, and time.     Gait: Gait normal.  Psychiatric:        Mood and Affect: Mood normal.        Behavior: Behavior normal.        Thought Content: Thought content normal.        Judgment: Judgment normal.     UC Treatments / Results  Labs (all labs ordered are listed, but only abnormal results are displayed) Labs Reviewed  GASTROINTESTINAL PANEL BY PCR, STOOL (REPLACES STOOL CULTURE) - Abnormal; Notable  for the following components:      Result Value   Enteropathogenic E coli (EPEC) DETECTED (*)    Norovirus GI/GII DETECTED (*)    All other components within normal limits  OVA + PARASITE EXAM  OVA AND PARASITE EXAMINATION  O&P RESULT    EKG   Radiology No results found.  Procedures Procedures (including critical care time)  Medications Ordered in UC Medications - No data to display  Initial Impression / Assessment and Plan / UC Course  I have reviewed the triage vital signs and the nursing notes. GI panel and stool O&P ordered and we will inform her if they are positive. See instructions.   Stool studies ordered and shows + for Noro and Enteropathogenic E Coli.    Final Clinical Impressions(s) / UC Diagnoses   Final diagnoses:  Diarrhea, unspecified type     Discharge Instructions      You may take Pepto as needed for the diarrhea Also drink vegetable broth, and sports drink or electrolyte drinks     ED Prescriptions   None    PDMP not reviewed this encounter.   Rodriguez-Southworth, Nettie Elm, PA-C 10/12/21 1500

## 2022-05-06 ENCOUNTER — Ambulatory Visit
Admission: EM | Admit: 2022-05-06 | Discharge: 2022-05-06 | Disposition: A | Discharge status: Home / Self Care | Payer: BC Managed Care – PPO

## 2022-05-06 DIAGNOSIS — K649 Unspecified hemorrhoids: Secondary | ICD-10-CM

## 2022-05-06 DIAGNOSIS — K6289 Other specified diseases of anus and rectum: Secondary | ICD-10-CM | POA: Diagnosis not present

## 2022-05-06 NOTE — ED Triage Notes (Signed)
Pt rectal bleeding x1 week, pt states she noticed blood when wiping, at first noticed it after her bowel movements. Pt states she has never had issue with bowel movements, last BM x couple hours ago

## 2022-05-06 NOTE — Discharge Instructions (Signed)
Continue to use OTC Hydrocortisone suppositories to ddecrease the swelling and itching from yoru hemorrhoids.  Apply a barrier cream such as Balmex, Desitin, or Bag Balm to the skin around your rectal opening to help protect from further irritation and the acidity of your bowel movements.  If you have any increase in pain, itching, or bleeding, or fever, you need to return for re-evaluation or seek care in the ER.   You may also need a referral to gastroenterology if your symptoms do not improve.

## 2022-05-06 NOTE — ED Provider Notes (Signed)
[facility]    CSN: [id number] Arrival date & time: 05/06/22  1859      History   Chief Complaint Chief Complaint  Patient presents with   Rectal Bleeding    HPI Victoria James is a 26 y.o. female.   HPI  26 year old female here for evaluation of possible hemorrhoids.  Patient reports that she has been experiencing issues with what she believes are hemorrhoids for the past month.  She has used over-the-counter pads, creams, and suppositories.  For the last week she has had some rectal bleeding and she is also noticed bleeding when she wipes.  She is not having any constipation and denies any straining to have a bowel movement.  She is vegan so she eats a lot of fiber and vegetables and does not have an issue with having bowel movements.  She also denies any fever.  She denies any straining or weight lifting that might of caused the symptoms.  She is complaining of itching and pain around her rectal area.  Past Medical History:  Diagnosis Date   Asthma     Patient Active Problem List   Diagnosis Date Noted   Ovarian cyst 11/08/2019    History reviewed. No pertinent surgical history.  OB History   No obstetric history on file.      Home Medications    Prior to Admission medications   Medication Sig Start Date End Date Taking? Authorizing Provider  albuterol (VENTOLIN HFA) 108 (90 Base) MCG/ACT inhaler Inhale 1-2 puffs into the lungs every 6 (six) hours as needed for wheezing or shortness of breath. 08/15/20 05/06/22 Yes Danton Clap, PA-C  Norgestimate-Ethinyl Estradiol Triphasic (TRI-SPRINTEC) 0.18/0.215/0.25 MG-35 MCG tablet Take 1 tablet by mouth at bedtime for 28 days. 02/07/20 05/06/22 Yes [name], MD    Family History Family History  Problem Relation Age of Onset   Lupus Mother    Diabetes Mother    Thyroid disease Mother    Thyroid disease Father     Social History Social History   Tobacco Use   Smoking status: Never    Smokeless tobacco: Never  Vaping Use   Vaping Use: Never used  Substance Use Topics   Alcohol use: Yes   Drug use: Never     Allergies   Patient has no known allergies.   Review of Systems Review of Systems  Gastrointestinal:  Positive for anal bleeding and rectal pain. Negative for abdominal pain, blood in stool, constipation and diarrhea.     Physical Exam Triage Vital Signs ED Triage Vitals  Enc Vitals Group     BP 05/06/22 1942 118/79     Pulse Rate 05/06/22 1942 86     Resp --      Temp 05/06/22 1942 98.5 F (36.9 C)     Temp Source 05/06/22 1942 Oral     SpO2 05/06/22 1942 99 %     Weight 05/06/22 1941 120 lb (54.4 kg)     Height 05/06/22 1941 5\' 2"  (1.575 m)     Head Circumference --      Peak Flow --      Pain Score 05/06/22 1941 5     Pain Loc --      Pain Edu? --      Excl. in [facility]? --    No data found.  Updated Vital Signs BP 118/79 (BP Location: Right Arm)   Pulse 86   Temp 98.5 F (36.9 C) (Oral)  Ht 5\' 2"  (1.575 m)   Wt 120 lb (54.4 kg)   LMP 05/03/2022 (Exact Date)   SpO2 99%   BMI 21.95 kg/m   Visual Acuity Right Eye Distance:   Left Eye Distance:   Bilateral Distance:    Right Eye Near:   Left Eye Near:    Bilateral Near:     Physical Exam Vitals and nursing note reviewed. Exam conducted with a chaperone present 05/05/2022 S, CMA).  Constitutional:      Appearance: Normal appearance. She is not ill-appearing.  HENT:     Head: Normocephalic and atraumatic.  Skin:    General: Skin is warm and dry.     Capillary Refill: Capillary refill takes less than 2 seconds.  Neurological:     General: No focal deficit present.     Mental Status: She is alert and oriented to person, place, and time.  Psychiatric:        Mood and Affect: Mood normal.        Behavior: Behavior normal.        Thought Content: Thought content normal.        Judgment: Judgment normal.      UC Treatments / Results  Labs (all labs ordered are listed, but  only abnormal results are displayed) Labs Reviewed - No data to display  EKG   Radiology No results found.  Procedures Procedures (including critical care time)  Medications Ordered in UC Medications - No data to display  Initial Impression / Assessment and Plan / UC Course  I have reviewed the triage vital signs and the nursing notes.  Pertinent labs & imaging results that were available during my care of the patient were reviewed by me and considered in my medical decision making (see chart for details).   Patient is a very pleasant, nontoxic-appearing 26 year old female here for evaluation of rectal itching, pain, and bleeding when she wipes for the past week.  She has been using over-the-counter hemorrhoid preparations without any improvement of her symptoms.  She denies any fever, straining to have a bowel movement, hard bowel movements, or blood in her stool.  She denies any abdominal pain.  With 22 S., CMA as a chaperone I performed a visual inspection of the patient's rectal area.  There is no bleeding from the rectum and there are no external hemorrhoids noted.  There are a few small areas of excoriation surrounding the skin near the perirectal region.  No induration, erythema, or fluctuance.  No drainage noted.  Believe that this is what is causing the patient pain and discomfort.  I have advised her that she should continue to use the hemorrhoid treatment if she is feeling like she is having itching inside of her rectum but that she should apply a barrier cream such as Balmex, Desitin, or bag balm to her perirectal area to protect the skin from abrasion and also from the acidity of her bowel movements and see if this helps improve her symptoms.  If she develops an increase in pain, bleeding, fever, or develops puslike drainage she should return for reevaluation or seek care in the ER.   Final Clinical Impressions(s) / UC Diagnoses   Final diagnoses:  Hemorrhoids,  unspecified hemorrhoid type  Perirectal skin irritation     Discharge Instructions      Continue to use OTC Hydrocortisone suppositories to ddecrease the swelling and itching from yoru hemorrhoids.  Apply a barrier cream such as Balmex, Desitin, or Bag Balm to  the skin around your rectal opening to help protect from further irritation and the acidity of your bowel movements.  If you have any increase in pain, itching, or bleeding, or fever, you need to return for re-evaluation or seek care in the ER.   You may also need a referral to gastroenterology if your symptoms do not improve.      ED Prescriptions   None    PDMP not reviewed this encounter.   Becky Augusta, NP 05/06/22 2038

## 2022-08-21 ENCOUNTER — Other Ambulatory Visit: Payer: Self-pay

## 2022-08-21 ENCOUNTER — Ambulatory Visit
Admission: EM | Admit: 2022-08-21 | Discharge: 2022-08-21 | Disposition: A | Discharge status: Home / Self Care | Payer: BC Managed Care – PPO

## 2022-08-21 DIAGNOSIS — U071 COVID-19: Secondary | ICD-10-CM | POA: Diagnosis not present

## 2022-08-21 MED ORDER — IBUPROFEN 600 MG PO TABS
600.0000 mg | ORAL_TABLET | Freq: Once | ORAL | Status: AC
  Administered 2022-08-21: 600 mg via ORAL

## 2022-08-21 MED ORDER — BENZONATATE 100 MG PO CAPS: 200.0000 mg | ORAL_CAPSULE | Freq: Three times a day (TID) | ORAL | 0 refills | Status: DC

## 2022-08-21 MED ORDER — PROMETHAZINE-DM 6.25-15 MG/5ML PO SYRP: 5.0000 mL | ORAL_SOLUTION | Freq: Four times a day (QID) | ORAL | 0 refills | Status: DC | PRN

## 2022-08-21 MED ORDER — NIRMATRELVIR/RITONAVIR (PAXLOVID)TABLET: 3.0000 | ORAL_TABLET | Freq: Two times a day (BID) | ORAL | 0 refills | Status: AC

## 2022-08-21 MED ORDER — IPRATROPIUM BROMIDE 0.06 % NA SOLN: 2.0000 | Freq: Four times a day (QID) | NASAL | 12 refills | Status: DC

## 2022-08-21 NOTE — ED Triage Notes (Signed)
Pt with positive COVID test at home today.  Had cold sx since NYE.  Today she had n/v/d, bodyaches, cough, nasal congestion and fatigue.

## 2022-08-21 NOTE — ED Provider Notes (Signed)
[facility]    CSN: [id number] Arrival date & time: 08/21/22  1934      History   Chief Complaint Chief Complaint  Patient presents with   Covid Positive    HPI Sheli X Feltz is a 27 y.o. female.   HPI  27 year old female here for evaluation of respiratory complaints.  Patient reports that her symptoms began to receive and they consist of runny nose, nasal congestion, fatigue, body aches, sore throat, cough, shortness of breath.  She has not experienced any wheezing.  Today she had some nausea, vomiting, and diarrhea.  She took a home COVID test and she was positive.  She does have a history of asthma and she takes inhaled steroids and albuterol as needed.  Past Medical History:  Diagnosis Date   Asthma     Patient Active Problem List   Diagnosis Date Noted   Ovarian cyst 11/08/2019    History reviewed. No pertinent surgical history.  OB History   No obstetric history on file.      Home Medications    Prior to Admission medications   Medication Sig Start Date End Date Taking? Authorizing Provider  benzonatate (TESSALON) 100 MG capsule Take 2 capsules (200 mg total) by mouth every 8 (eight) hours. 08/21/22  Yes Margarette Canada, NP  Budesonide 90 MCG/ACT inhaler Inhale 2 puffs into the lungs. 06/17/22  Yes [provider]  ipratropium (ATROVENT) 0.06 % nasal spray Place 2 sprays into both nostrils 4 (four) times daily. 08/21/22  Yes Margarette Canada, NP  nirmatrelvir/ritonavir (PAXLOVID) 20 x 150 MG & 10 x 100MG  TABS Take 3 tablets by mouth 2 (two) times daily for 5 days. 08/21/22 08/26/22 Yes Margarette Canada, NP  promethazine-dextromethorphan (PROMETHAZINE-DM) 6.25-15 MG/5ML syrup Take 5 mLs by mouth 4 (four) times daily as needed. 08/21/22  Yes Margarette Canada, NP  albuterol (VENTOLIN HFA) 108 (90 Base) MCG/ACT inhaler Inhale 1-2 puffs into the lungs every 6 (six) hours as needed for wheezing or shortness of breath. 08/15/20 05/06/22  Danton Clap, PA-C   Norgestimate-Ethinyl Estradiol Triphasic (TRI-SPRINTEC) 0.18/0.215/0.25 MG-35 MCG tablet Take 1 tablet by mouth at bedtime for 28 days. 02/07/20 05/06/22  [name], MD    Family History Family History  Problem Relation Age of Onset   Lupus Mother    Diabetes Mother    Thyroid disease Mother    Thyroid disease Father     Social History Social History   Tobacco Use   Smoking status: Never   Smokeless tobacco: Never  Vaping Use   Vaping Use: Never used  Substance Use Topics   Alcohol use: Yes    Comment: social   Drug use: Never     Allergies   Patient has no known allergies.   Review of Systems Review of Systems  Constitutional:  Positive for fever.  HENT:  Positive for congestion, rhinorrhea and sore throat.   Respiratory:  Positive for cough and shortness of breath. Negative for wheezing.   Gastrointestinal:  Negative for diarrhea, nausea and vomiting.     Physical Exam Triage Vital Signs ED Triage Vitals  Enc Vitals Group     BP 08/21/22 2006 116/80     Pulse Rate 08/21/22 2006 (!) 115     Resp 08/21/22 2006 18     Temp 08/21/22 2006 (!) 100.5 F (38.1 C)     Temp Source 08/21/22 2006 Oral     SpO2 08/21/22 2006 100 %     Weight  08/21/22 2003 115 lb (52.2 kg)     Height 08/21/22 2003 5\' 2"  (1.575 m)     Head Circumference --      Peak Flow --      Pain Score 08/21/22 2002 7     Pain Loc --      Pain Edu? --      Excl. in [place]? --    No data found.  Updated Vital Signs BP 116/80 (BP Location: Left Arm)   Pulse (!) 115   Temp (!) 100.5 F (38.1 C) (Oral)   Resp 18   Ht 5\' 2"  (1.575 m)   Wt 115 lb (52.2 kg)   LMP 07/26/2022   SpO2 100%   BMI 21.03 kg/m   Visual Acuity Right Eye Distance:   Left Eye Distance:   Bilateral Distance:    Right Eye Near:   Left Eye Near:    Bilateral Near:     Physical Exam Vitals and nursing note reviewed.  Constitutional:      Appearance: Normal appearance. She is not ill-appearing.  HENT:      Head: Normocephalic and atraumatic.     Right Ear: Tympanic membrane, ear canal and external ear normal. There is no impacted cerumen.     Left Ear: Tympanic membrane, ear canal and external ear normal. There is no impacted cerumen.     Nose: Congestion and rhinorrhea present.     Comments: Mucosa is erythematous edematous with clear discharge in both nares.    Mouth/Throat:     Mouth: Mucous membranes are moist.     Pharynx: Oropharynx is clear. Posterior oropharyngeal erythema present.     Comments: Posterior pharynx has erythema and injection with clear postnasal drip. Cardiovascular:     Rate and Rhythm: Normal rate and regular rhythm.     Pulses: Normal pulses.     Heart sounds: Normal heart sounds. No murmur heard.    No friction rub. No gallop.  Pulmonary:     Effort: Pulmonary effort is normal.     Breath sounds: Normal breath sounds. No wheezing, rhonchi or rales.  Musculoskeletal:     Cervical back: Normal range of motion and neck supple.  Lymphadenopathy:     Cervical: No cervical adenopathy.  Skin:    General: Skin is warm and dry.     Capillary Refill: Capillary refill takes less than 2 seconds.     Findings: No erythema or rash.  Neurological:     General: No focal deficit present.     Mental Status: She is alert and oriented to person, place, and time.  Psychiatric:        Mood and Affect: Mood normal.        Behavior: Behavior normal.        Thought Content: Thought content normal.        Judgment: Judgment normal.      UC Treatments / Results  Labs (all labs ordered are listed, but only abnormal results are displayed) Labs Reviewed - No data to display  EKG   Radiology No results found.  Procedures Procedures (including critical care time)  Medications Ordered in UC Medications  ibuprofen (ADVIL) tablet 600 mg (600 mg Oral Given 08/21/22 2010)    Initial Impression / Assessment and Plan / UC Course  I have reviewed the triage vital signs and the  nursing notes.  Pertinent labs & imaging results that were available during my care of the patient were reviewed by me and  considered in my medical decision making (see chart for details).   Patient is a pleasant, nontoxic-appearing 27 year old female here for evaluation after testing COVID-positive today.  She does have a history of asthma and she uses inhaled corticosteroid which she believes is Flovent and albuterol as needed.  She states she has been using 2 puffs of her inhaled steroid versus 1 twice daily starting last night for control of her respiratory symptoms.  She is also been using her albuterol inhaler.  Exam does reveal inflammation of her upper respiratory tract with inflamed nasal mucosa and clear nasal discharge.  Posterior pharyngeal erythema and clear postnasal drip.  Cardiopulmonary exam bisque lung sounds in all fields.  Given the fact that patient has asthma she qualifies for antiviral therapy.  Patient has a BMP from [place] from 05/24/2022 showing a GFR of 123.  I will discharge her home on Paxlovid at the normal dosing.  I also vies her to use her albuterol inhaler as needed for shortness breath or wheezing.  Additionally, I will prescribe Atrovent nasal spray, Tessalon Perles, Promethazine DM cough syrup.  We discussed quarantine and ER precautions.   Final Clinical Impressions(s) / UC Diagnoses   Final diagnoses:  [id number]     Discharge Instructions      You tested positive for COVID-19 at home.  You will need to quarantine for 5 days from onset of your symptoms.  After 5 days you can break quarantine if your symptoms have improved and you have not had a fever for 24 hours without Tylenol or ibuprofen.  Use over-the-counter Tylenol and ibuprofen according to package instructions as needed for pain or fever.  Take the Paxlovid twice daily for 5 days for treatment of COVID-19.  Use the Atrovent nasal spray, 2 squirts in each nostril every 6 hours, as needed for runny nose  and postnasal drip.  Use the Tessalon Perles every 8 hours during the day.  Take them with a small sip of water.  They may give you some numbness to the base of your tongue or a metallic taste in your mouth, this is normal.  Use the Promethazine DM cough syrup at bedtime for cough and congestion.  It will make you drowsy so do not take it during the day.  If you develop any shortness of breath, especially at rest, feel as though you cannot catch your breath, feel as though you cannot speak in full sentences, or your lips begin turning blue you need to call 911 or go to the ER.     ED Prescriptions     Medication Sig Dispense Auth. Provider   nirmatrelvir/ritonavir (PAXLOVID) 20 x 150 MG & 10 x 100MG  TABS Take 3 tablets by mouth 2 (two) times daily for 5 days. 30 tablet Margarette Canada, NP   benzonatate (TESSALON) 100 MG capsule Take 2 capsules (200 mg total) by mouth every 8 (eight) hours. 21 capsule Margarette Canada, NP   ipratropium (ATROVENT) 0.06 % nasal spray Place 2 sprays into both nostrils 4 (four) times daily. 15 mL Margarette Canada, NP   promethazine-dextromethorphan (PROMETHAZINE-DM) 6.25-15 MG/5ML syrup Take 5 mLs by mouth 4 (four) times daily as needed. 118 mL Margarette Canada, NP      PDMP not reviewed this encounter.   Margarette Canada, NP 08/21/22 2030

## 2022-08-21 NOTE — Discharge Instructions (Signed)
You tested positive for COVID-19 at home.  You will need to quarantine for 5 days from onset of your symptoms.  After 5 days you can break quarantine if your symptoms have improved and you have not had a fever for 24 hours without Tylenol or ibuprofen.  Use over-the-counter Tylenol and ibuprofen according to package instructions as needed for pain or fever.  Take the Paxlovid twice daily for 5 days for treatment of COVID-19.  Use the Atrovent nasal spray, 2 squirts in each nostril every 6 hours, as needed for runny nose and postnasal drip.  Use the Tessalon Perles every 8 hours during the day.  Take them with a small sip of water.  They may give you some numbness to the base of your tongue or a metallic taste in your mouth, this is normal.  Use the Promethazine DM cough syrup at bedtime for cough and congestion.  It will make you drowsy so do not take it during the day.  If you develop any shortness of breath, especially at rest, feel as though you cannot catch your breath, feel as though you cannot speak in full sentences, or your lips begin turning blue you need to call 911 or go to the ER.

## 2022-12-05 ENCOUNTER — Ambulatory Visit
Admission: EM | Admit: 2022-12-05 | Discharge: 2022-12-05 | Disposition: A | Discharge status: Home / Self Care | Payer: BC Managed Care – PPO | Attending: Emergency Medicine | Admitting: Emergency Medicine

## 2022-12-05 DIAGNOSIS — B9689 Other specified bacterial agents as the cause of diseases classified elsewhere: Secondary | ICD-10-CM | POA: Insufficient documentation

## 2022-12-05 DIAGNOSIS — N760 Acute vaginitis: Secondary | ICD-10-CM | POA: Insufficient documentation

## 2022-12-05 LAB — URINALYSIS, W/ REFLEX TO CULTURE (INFECTION SUSPECTED)
Bilirubin Urine: NEGATIVE
Glucose, UA: NEGATIVE mg/dL
Hgb urine dipstick: NEGATIVE
Ketones, ur: NEGATIVE mg/dL
Nitrite: NEGATIVE
Protein, ur: NEGATIVE mg/dL
Specific Gravity, Urine: 1.025 (ref 1.005–1.030)
pH: 6.5 (ref 5.0–8.0)

## 2022-12-05 LAB — WET PREP, GENITAL
Sperm: NONE SEEN
Trich, Wet Prep: NONE SEEN
WBC, Wet Prep HPF POC: <10 — AB (ref <10)
Yeast Wet Prep HPF POC: NONE SEEN

## 2022-12-05 MED ORDER — ONDANSETRON 4 MG PO TBDP: 4.0000 mg | ORAL_TABLET | Freq: Three times a day (TID) | ORAL | 0 refills | Status: DC | PRN

## 2022-12-05 MED ORDER — METRONIDAZOLE 500 MG PO TABS: 500.0000 mg | ORAL_TABLET | Freq: Two times a day (BID) | ORAL | 0 refills | Status: DC

## 2022-12-05 NOTE — Discharge Instructions (Addendum)
Today you are being treated  for  Bacterial vaginosis   Urinalysis is negative  Wet prep is negative for yeast and trichomoniasis, positive for bacterial vaginosis  Take Metronidazole 500 mg twice a day for 7 days, do not drink alcohol while using medication, this will make you feel sick   You may use Zofran every 8 hours as needed to help calm your nausea, increase your fluid intake until you are able to tolerate food as normal  Bacterial vaginosis which results from an overgrowth of one on several organisms that are normally present in your vagina. Vaginosis is an inflammation of the vagina that can result in discharge, itching and pain.  Symptoms are most likely caused by the change in your soap, discontinue use and switch back to original product or attempt a different scent   In addition: Avoid baths, hot tubs and whirlpool spas.  Don't use scented or harsh soaps Avoid irritants. These include scented tampons and pads. Wipe from front to back after using the toilet. Don't douche. Your vagina doesn't require cleansing other than normal bathing.  Use a condom.  Wear cotton underwear, this fabric absorbs some moisture.

## 2022-12-05 NOTE — ED Provider Notes (Signed)
[facility]    CSN: [id number] Arrival date & time: 12/05/22  1947      History   Chief Complaint No chief complaint on file.   HPI Meryl X Yowell is a 27 y.o. female.   For evaluation of nausea without vomiting, lower abdominal pain and bilateral low back pain beginning 2 days ago.  Abdominal pain is intermittent described as a cramping, present to the pelvic region.  Unable to tolerate foods but tolerating some liquids.  Sexually active, no concern for STD.  Last menstrual period 11/15/2022, pregnancy test 1 day ago with, negative.  Endorses 1 soft bowel movement today, denies diarrhea or constipation.  Denies fevers, URI symptoms.  Denies changes in diet or recent travel, no other close contacts have similar symptoms.  Denies urinary or vaginal symptoms.    Past Medical History:  Diagnosis Date   Asthma     Patient Active Problem List   Diagnosis Date Noted   Ovarian cyst 11/08/2019    No past surgical history on file.  OB History   No obstetric history on file.      Home Medications    Prior to Admission medications   Medication Sig Start Date End Date Taking? Authorizing Provider  albuterol (VENTOLIN HFA) 108 (90 Base) MCG/ACT inhaler Inhale 1-2 puffs into the lungs every 6 (six) hours as needed for wheezing or shortness of breath. 08/15/20 05/06/22  Shirlee Latch, PA-C  benzonatate (TESSALON) 100 MG capsule Take 2 capsules (200 mg total) by mouth every 8 (eight) hours. 08/21/22   Becky Augusta, NP  Budesonide 90 MCG/ACT inhaler Inhale 2 puffs into the lungs. 06/17/22   [provider]  ipratropium (ATROVENT) 0.06 % nasal spray Place 2 sprays into both nostrils 4 (four) times daily. 08/21/22   Becky Augusta, NP  Norgestimate-Ethinyl Estradiol Triphasic (TRI-SPRINTEC) 0.18/0.215/0.25 MG-35 MCG tablet Take 1 tablet by mouth at bedtime for 28 days. 02/07/20 05/06/22  [name], MD  promethazine-dextromethorphan (PROMETHAZINE-DM) 6.25-15  MG/5ML syrup Take 5 mLs by mouth 4 (four) times daily as needed. 08/21/22   Becky Augusta, NP    Family History Family History  Problem Relation Age of Onset   Lupus Mother    Diabetes Mother    Thyroid disease Mother    Thyroid disease Father     Social History Social History   Tobacco Use   Smoking status: Never   Smokeless tobacco: Never  Vaping Use   Vaping Use: Never used  Substance Use Topics   Alcohol use: Yes    Comment: social   Drug use: Never     Allergies   Patient has no known allergies.   Review of Systems Review of Systems   Physical Exam Triage Vital Signs ED Triage Vitals  Enc Vitals Group     BP      Pulse      Resp      Temp      Temp src      SpO2      Weight      Height      Head Circumference      Peak Flow      Pain Score      Pain Loc      Pain Edu?      Excl. in [facility]?    No data found.  Updated Vital Signs There were no vitals taken for this visit.  Visual Acuity Right Eye Distance:   Left Eye Distance:  Bilateral Distance:    Right Eye Near:   Left Eye Near:    Bilateral Near:     Physical Exam Constitutional:      Appearance: Normal appearance.  Eyes:     Extraocular Movements: Extraocular movements intact.  Pulmonary:     Effort: Pulmonary effort is normal.  Abdominal:     General: Abdomen is flat. Bowel sounds are normal.     Palpations: Abdomen is soft.     Tenderness: There is abdominal tenderness in the suprapubic area.  Skin:    General: Skin is warm and dry.  Neurological:     Mental Status: She is alert and oriented to person, place, and time. Mental status is at baseline.      UC Treatments / Results  Labs (all labs ordered are listed, but only abnormal results are displayed) Labs Reviewed - No data to display  EKG   Radiology No results found.  Procedures Procedures (including critical care time)  Medications Ordered in UC Medications - No data to display  Initial Impression /  Assessment and Plan / UC Course  I have reviewed the triage vital signs and the nursing notes.  Pertinent labs & imaging results that were available during my care of the patient were reviewed by me and considered in my medical decision making (see chart for details).  Bacterial vaginosis  Confirmed on wet prep, urinalysis negative, discussed findings with patient, prescribed metronidazole and Zofran and discussed administration, advised against alcohol use during treatment, advised increase fluid intake until able to tolerate foods, may follow-up with his urgent care gynecologist as needed  Final Clinical Impressions(s) / UC Diagnoses   Final diagnoses:  None   Discharge Instructions   None    ED Prescriptions   None    PDMP not reviewed this encounter.   Valinda Hoar, [place] 12/09/22 [phone]

## 2022-12-05 NOTE — ED Triage Notes (Signed)
Provider triage  

## 2023-02-04 ENCOUNTER — Ambulatory Visit
Admission: EM | Admit: 2023-02-04 | Discharge: 2023-02-04 | Disposition: A | Discharge status: Home / Self Care | Payer: Commercial Managed Care - PPO | Attending: Physician Assistant | Admitting: Physician Assistant

## 2023-02-04 DIAGNOSIS — B9689 Other specified bacterial agents as the cause of diseases classified elsewhere: Secondary | ICD-10-CM | POA: Insufficient documentation

## 2023-02-04 DIAGNOSIS — N760 Acute vaginitis: Secondary | ICD-10-CM | POA: Diagnosis not present

## 2023-02-04 DIAGNOSIS — N898 Other specified noninflammatory disorders of vagina: Secondary | ICD-10-CM | POA: Insufficient documentation

## 2023-02-04 DIAGNOSIS — N939 Abnormal uterine and vaginal bleeding, unspecified: Secondary | ICD-10-CM | POA: Insufficient documentation

## 2023-02-04 LAB — URINALYSIS, W/ REFLEX TO CULTURE (INFECTION SUSPECTED)
Bilirubin Urine: NEGATIVE
Glucose, UA: NEGATIVE mg/dL
Ketones, ur: NEGATIVE mg/dL
Leukocytes,Ua: NEGATIVE
Nitrite: NEGATIVE
Protein, ur: NEGATIVE mg/dL
Specific Gravity, Urine: 1.02 (ref 1.005–1.030)
pH: 7 (ref 5.0–8.0)

## 2023-02-04 LAB — WET PREP, GENITAL
Sperm: NONE SEEN
Trich, Wet Prep: NONE SEEN
WBC, Wet Prep HPF POC: <10 — AB (ref <10)
Yeast Wet Prep HPF POC: NONE SEEN

## 2023-02-04 LAB — PREGNANCY, URINE: Preg Test, Ur: NEGATIVE

## 2023-02-04 MED ORDER — METRONIDAZOLE 500 MG PO TABS: 500.0000 mg | ORAL_TABLET | Freq: Two times a day (BID) | ORAL | 0 refills | Status: AC

## 2023-02-04 NOTE — ED Triage Notes (Signed)
Pt c/o vaginal itching & spotting x3 days. States she recently returned from [place] went horseback riding & [place] ocean. Has been using Aquaphor for her itching w/o relief.

## 2023-02-04 NOTE — ED Provider Notes (Signed)
[facility]    CSN: [id number] Arrival date & time: 02/04/23  0935      History   Chief Complaint Chief Complaint  Patient presents with   Vaginal Itching   Vaginal Bleeding    HPI Victoria James is a 27 y.o. female presenting for 3-day history of vaginal itching, swelling and vaginal spotting.  Patient reports she recently returned from the [place].  States she went horseback riding and is in a lot of warm water.  Reports change in her diet.  States she thinks some of that could have caused her symptoms.  She denies vaginal discharge, dysuria, frequency, urgency, abdominal pain, flank pain, fever.  No concern for STIs reported.  History of BV about 2 months ago.  States she took the entire treatment.  She has recently been using Aquaphor without relief.  LMP was about a month ago.  HPI  Past Medical History:  Diagnosis Date   Asthma     Patient Active Problem List   Diagnosis Date Noted   Ovarian cyst 11/08/2019    History reviewed. No pertinent surgical history.  OB History   No obstetric history on file.      Home Medications    Prior to Admission medications   Medication Sig Start Date End Date Taking? Authorizing Provider  AUROVELA 24 FE 1-20 MG-MCG(24) tablet Take 1 tablet by mouth daily.   Yes [provider]  Budesonide 90 MCG/ACT inhaler Inhale 2 puffs into the lungs. 06/17/22  Yes [provider]  albuterol (VENTOLIN HFA) 108 (90 Base) MCG/ACT inhaler Inhale 1-2 puffs into the lungs every 6 (six) hours as needed for wheezing or shortness of breath. 08/15/20 05/06/22  Shirlee Latch, PA-C  metroNIDAZOLE (FLAGYL) 500 MG tablet Take 1 tablet (500 mg total) by mouth 2 (two) times daily. 02/04/23   Shirlee Latch, PA-C  Norgestimate-Ethinyl Estradiol Triphasic (TRI-SPRINTEC) 0.18/0.215/0.25 MG-35 MCG tablet Take 1 tablet by mouth at bedtime for 28 days. 02/07/20 05/06/22  [name], MD    Family  History Family History  Problem Relation Age of Onset   Lupus Mother    Diabetes Mother    Thyroid disease Mother    Thyroid disease Father     Social History Social History   Tobacco Use   Smoking status: Never   Smokeless tobacco: Never  Vaping Use   Vaping Use: Never used  Substance Use Topics   Alcohol use: Yes    Comment: social   Drug use: Never     Allergies   Patient has no known allergies.   Review of Systems Review of Systems  Constitutional:  Negative for chills, fatigue and fever.  Gastrointestinal:  Negative for abdominal pain, diarrhea, nausea and vomiting.  Genitourinary:  Positive for vaginal bleeding and vaginal pain. Negative for decreased urine volume, dysuria, flank pain, frequency, hematuria, pelvic pain, urgency and vaginal discharge.  Musculoskeletal:  Negative for back pain.  Skin:  Negative for rash.     Physical Exam Triage Vital Signs ED Triage Vitals  Enc Vitals Group     BP --      Pulse --      Resp 02/04/23 0942 16     Temp --      Temp Source 02/04/23 0942 Oral     SpO2 --      Weight 02/04/23 0940 116 lb (52.6 kg)     Height 02/04/23 0940 5\' 2"  (1.575 m)  Head Circumference --      Peak Flow --      Pain Score 02/04/23 0946 4     Pain Loc --      Pain Edu? --      Excl. in GC? --    No data found.  Updated Vital Signs BP 119/82 (BP Location: Right Arm)   Pulse 71   Temp 99 F (37.2 C) (Oral)   Resp 16   Ht 5\' 2"  (1.575 m)   Wt 116 lb (52.6 kg)   SpO2 98%   BMI 21.22 kg/m       Physical Exam Vitals and nursing note reviewed.  Constitutional:      General: She is not in acute distress.    Appearance: Normal appearance. She is not ill-appearing or toxic-appearing.  HENT:     Head: Normocephalic and atraumatic.  Eyes:     General: No scleral icterus.       Right eye: No discharge.        Left eye: No discharge.     Conjunctiva/sclera: Conjunctivae normal.  Cardiovascular:     Rate and Rhythm: Normal  rate and regular rhythm.     Heart sounds: Normal heart sounds.  Pulmonary:     Effort: Pulmonary effort is normal. No respiratory distress.     Breath sounds: Normal breath sounds.  Abdominal:     Palpations: Abdomen is soft.     Tenderness: There is no abdominal tenderness. There is no right CVA tenderness or left CVA tenderness.  Musculoskeletal:     Cervical back: Neck supple.  Skin:    General: Skin is dry.  Neurological:     General: No focal deficit present.     Mental Status: She is alert. Mental status is at baseline.     Motor: No weakness.     Gait: Gait normal.  Psychiatric:        Mood and Affect: Mood normal.        Behavior: Behavior normal.        Thought Content: Thought content normal.      UC Treatments / Results  Labs (all labs ordered are listed, but only abnormal results are displayed) Labs Reviewed  WET PREP, GENITAL - Abnormal; Notable for the following components:      Result Value   Clue Cells Wet Prep HPF POC PRESENT (*)    WBC, Wet Prep HPF POC <10 (*)    All other components within normal limits  URINALYSIS, W/ REFLEX TO CULTURE (INFECTION SUSPECTED) - Abnormal; Notable for the following components:   APPearance HAZY (*)    Hgb urine dipstick TRACE (*)    Bacteria, UA FEW (*)    All other components within normal limits  PREGNANCY, URINE    EKG   Radiology No results found.  Procedures Procedures (including critical care time)  Medications Ordered in UC Medications - No data to display  Initial Impression / Assessment and Plan / UC Course  I have reviewed the triage vital signs and the nursing notes.  Pertinent labs & imaging results that were available during my care of the patient were reviewed by me and considered in my medical decision making (see chart for details).   27 year old female presenting for vaginal itching, swelling and spotting for the past 3 days.  Patient elects perform vaginal self swab which is positive for  clue cells.  Will treat for BV with metronidazole oral medication.  Urine pregnancy negative.  UA not consistent with UTI.  Supportive care.  Reviewed return and ER precautions.   Final Clinical Impressions(s) / UC Diagnoses   Final diagnoses:  BV (bacterial vaginosis)  Vaginal itching  Vaginal spotting     Discharge Instructions      The most common types of vaginal infections are yeast infections and bacterial vaginosis. Neither of which are really considered to be sexually transmitted. Often a pH swab or wet prep is performed and if abnormal may reveal either type of infection. Begin metronidazole if prescribed for possible BV infection. If there is concern for yeast infection, fluconazole is often prescribed . Take this as directed. You may also apply topical miconazole (can be purchased OTC) externally for relief of itching. Increase rest and fluid intake. If labs sent out, we will call within 2-5 days with results and amend treatment if necessary. Always try to use pH balanced washes/wipes, urinate after intercourse, stay hydrated, and take probiotics if you are prone to vaginal infections. Return or see PCP or gynecologist for new/worsening infections.       ED Prescriptions     Medication Sig Dispense Auth. Provider   metroNIDAZOLE (FLAGYL) 500 MG tablet Take 1 tablet (500 mg total) by mouth 2 (two) times daily. 14 tablet Gareth Morgan      PDMP not reviewed this encounter.   Shirlee Latch, PA-C 02/04/23 1034

## 2023-02-04 NOTE — Discharge Instructions (Signed)
The most common types of vaginal infections are yeast infections and bacterial vaginosis. Neither of which are really considered to be sexually transmitted. Often a pH swab or wet prep is performed and if abnormal may reveal either type of infection. Begin metronidazole if prescribed for possible BV infection. If there is concern for yeast infection, fluconazole is often prescribed . Take this as directed. You may also apply topical miconazole (can be purchased OTC) externally for relief of itching. Increase rest and fluid intake. If labs sent out, we will call within 2-5 days with results and amend treatment if necessary. Always try to use pH balanced washes/wipes, urinate after intercourse, stay hydrated, and take probiotics if you are prone to vaginal infections. Return or see PCP or gynecologist for new/worsening infections.   

## 2023-08-14 NOTE — Progress Notes (Signed)
 [facility] ENCOUNTER    CHIEF COMPLAINT   Chief Complaint  Patient presents with  . Chest Pain    HPI  Victoria James is a 27 y.o. female who presents to urgent care for chest pain x 2 to 3 weeks.  She states the pain is left-sided and points to the left side of her chest.  Describes as sharp in nature. She states today she also had tightness across her chest.  The patient states she usually can put pressure to the area and massage it and the pain resolves.  She states today she thought it was related to her asthma and used her asthma pump with minimal relief.  The patient states that sometimes the pain does happen a hour or 2 after eating.  Significant other states symptoms happen after patient had pizza, salad, a cupcake, and quesadilla.  Possible relief with Tums.  No pain at this time.  No nausea, no vomiting no jaw pain no left arm pain, no numbness, or no tingling. Hx of Asthma. Currently on birth control. No cough. Denies fever.   ALLERGIES   No Known Allergies  SOCIAL HISTORY Social History   Tobacco Use  . Smoking status: Never  . Smokeless tobacco: Never    ROS   VITAL SIGNS:   Vitals:   08/14/23 1751  BP: 120/83  Pulse: 69  Resp: 16  Temp: 36.8 C (98.2 F)  SpO2: 100%    Physical Exam Vitals and nursing note reviewed.  Constitutional:      General: She is not in acute distress.    Appearance: Normal appearance. She is not ill-appearing, toxic-appearing or diaphoretic.  HENT:     Head: Normocephalic and atraumatic.     Nose: Nose normal.     Mouth/Throat:     Lips: Pink.     Mouth: Mucous membranes are moist.     Pharynx: Uvula midline.  Eyes:     General:        Right eye: No discharge.        Left eye: No discharge.     Extraocular Movements: Extraocular movements intact.     Conjunctiva/sclera: Conjunctivae normal.     Pupils: Pupils are equal, round, and reactive to light.  Cardiovascular:     Rate and Rhythm: Normal rate and regular rhythm.      Pulses: Normal pulses.     Heart sounds: Normal heart sounds.  Pulmonary:     Effort: Pulmonary effort is normal. No respiratory distress.     Breath sounds: Normal breath sounds.  Musculoskeletal:        General: Normal range of motion.     Cervical back: Normal range of motion and neck supple.  Skin:    General: Skin is warm and dry.     Capillary Refill: Capillary refill takes less than 2 seconds.  Neurological:     General: No focal deficit present.     Mental Status: She is alert and oriented to person, place, and time.  Psychiatric:        Mood and Affect: Mood normal.        Behavior: Behavior normal. Behavior is cooperative.         Any obtained pertinent Labs & Imaging studies were reviewed. (See chart for details)   PHYSICAL EXAM    EKG   Encounter Date: 08/14/23  ECG 12 Lead  Result Value   EKG Systolic BP    EKG Diastolic BP  EKG Ventricular Rate 75   EKG Atrial Rate 75   EKG P-R Interval 174   EKG QRS Duration 74   EKG Q-T Interval 380   EKG QTC Calculation 424   EKG Calculated P Axis 52   EKG Calculated R Axis 8   EKG Calculated T Axis 57   QTC Fredericia 409   Narrative   SINUS RHYTHM WITH SINUS ARRHYTHMIA POSSIBLE LEFT ATRIAL ENLARGEMENT  BORDERLINE ECG NO PREVIOUS ECGS AVAILABLE    PROCEDURE Procedures   RADIOLOGY   ECG 12 Lead Result Date: 08/14/2023 SINUS RHYTHM WITH SINUS ARRHYTHMIA POSSIBLE LEFT ATRIAL ENLARGEMENT BORDERLINE ECG NO PREVIOUS ECGS AVAILABLE    LABS Results for orders placed or performed in visit on 08/14/23  ECG 12 Lead  Result Value Ref Range   EKG Systolic BP  mmHg   EKG Diastolic BP  mmHg   EKG Ventricular Rate 75 BPM   EKG Atrial Rate 75 BPM   EKG P-R Interval 174 ms   EKG QRS Duration 74 ms   EKG Q-T Interval 380 ms   EKG QTC Calculation 424 ms   EKG Calculated P Axis 52 degrees   EKG Calculated R Axis 8 degrees   EKG Calculated T Axis 57 degrees   QTC Fredericia 409 ms     ASSESSMENT/PLAN &  ED COURSE Victoria James was seen today for chest pain.  Diagnoses and all orders for this visit:  Chest pain, unspecified type -     Notify Provider -     ECG 12 Lead; Future -     ECG 12 Lead  Other orders -     famotidine (PEPCID) 40 MG tablet; Take 1 tablet (40 mg total) by mouth every evening.    CLINICAL IMPRESSION Final diagnoses:  Chest pain, unspecified type (Primary)  Chest pain x 2 to 3 weeks. Denies pain at this time. Normal S1 and S2, RRR, no murmurs or gallops or clicks.  Lungs CTA.  Such as saturation 100% on room air. EKG with NSR with marked sinus arrhythmia.  Ventricular rate 75 bpm.  QRS duration 74 MS.  PR interval 174 MS.  No depression or elevation. Symptoms concerning for possible GERD Rx for Pepcid sent to pharmacy Tums as needed Advised patient to go to the emergency department if pain returns as further workup to possibly include labs and imaging may be warranted.  Otherwise patient is to follow up with her PCP RTC and ER precautions given The patient and her significant other verbalized understanding and agree with plan of care Patient discharged in stable condition     MEDICATIONS ADMINISTERED DURING THE VISIT    PRESCRIPTIONS THIS VISIT New Prescriptions   FAMOTIDINE (PEPCID) 40 MG TABLET    Take 1 tablet (40 mg total) by mouth every evening.     Follow-up as Needed  and Follow-up with PCP   Pertinent labs & imaging results which were available during my care of the patient were reviewed by me (see chart for details).  Time seen: August 14, 2023 6:16 PM  I have reviewed the triage vital signs and the nursing notes.   I reviewed indications for follow-up and ED precautions including worsening shortness of breath, prolonged fevers, or any atypical symptoms.   The patient was given an opportunity to ask questions about the treatment plan, and all questions were addressed to the best of my ability with the information available. Patient  stable at the time of discharge  This note was narrated with the use of dragon software. It was proof read by me, but may contain transcription errors. This does not reflect the level of care that was administered by our team today.   Lorenz Victoria James Counter, FNP
